# Patient Record
Sex: Male | Born: 1950 | Race: White | Hispanic: No | Marital: Married | Smoking: Former smoker
Health system: Southern US, Community
[De-identification: ages and names within clinical notes are randomized; demographics above are authoritative.]

## PROBLEM LIST (undated history)

## (undated) DIAGNOSIS — K759 Inflammatory liver disease, unspecified: Secondary | ICD-10-CM

## (undated) DIAGNOSIS — E785 Hyperlipidemia, unspecified: Secondary | ICD-10-CM

## (undated) DIAGNOSIS — G4730 Sleep apnea, unspecified: Secondary | ICD-10-CM

## (undated) HISTORY — PX: VASECTOMY: SHX75

## (undated) HISTORY — PX: COLONOSCOPY: SHX174

## (undated) HISTORY — PX: FRACTURE SURGERY: SHX138

---

## 1971-02-15 HISTORY — PX: FINGER AMPUTATION: SHX636

## 2015-09-11 ENCOUNTER — Encounter (HOSPITAL_BASED_OUTPATIENT_CLINIC_OR_DEPARTMENT_OTHER): Payer: Self-pay | Admitting: *Deleted

## 2015-09-11 ENCOUNTER — Emergency Department (HOSPITAL_BASED_OUTPATIENT_CLINIC_OR_DEPARTMENT_OTHER)
Admission: EM | Admit: 2015-09-11 | Discharge: 2015-09-11 | Disposition: A | Discharge status: Home / Self Care | Payer: Medicare Other | Attending: Emergency Medicine | Admitting: Emergency Medicine

## 2015-09-11 ENCOUNTER — Emergency Department (HOSPITAL_BASED_OUTPATIENT_CLINIC_OR_DEPARTMENT_OTHER): Payer: Medicare Other

## 2015-09-11 DIAGNOSIS — Y929 Unspecified place or not applicable: Secondary | ICD-10-CM | POA: Insufficient documentation

## 2015-09-11 DIAGNOSIS — Y939 Activity, unspecified: Secondary | ICD-10-CM | POA: Diagnosis not present

## 2015-09-11 DIAGNOSIS — S82851A Displaced trimalleolar fracture of right lower leg, initial encounter for closed fracture: Secondary | ICD-10-CM | POA: Diagnosis not present

## 2015-09-11 DIAGNOSIS — Z79899 Other long term (current) drug therapy: Secondary | ICD-10-CM | POA: Insufficient documentation

## 2015-09-11 DIAGNOSIS — W109XXA Fall (on) (from) unspecified stairs and steps, initial encounter: Secondary | ICD-10-CM | POA: Insufficient documentation

## 2015-09-11 DIAGNOSIS — M25571 Pain in right ankle and joints of right foot: Secondary | ICD-10-CM | POA: Diagnosis present

## 2015-09-11 DIAGNOSIS — Y999 Unspecified external cause status: Secondary | ICD-10-CM | POA: Diagnosis not present

## 2015-09-11 MED ORDER — OXYCODONE-ACETAMINOPHEN 5-325 MG PO TABS: 1.0000 | ORAL_TABLET | Freq: Four times a day (QID) | ORAL | 0 refills | Status: DC | PRN

## 2015-09-11 NOTE — ED Notes (Signed)
Patient transported to X-ray 

## 2015-09-11 NOTE — ED Provider Notes (Signed)
[facility] Note   CSN: [id number] Arrival date & time: 09/11/15  1926  By signing my name below, I, Alyssa Grove, attest that this documentation has been prepared under the direction and in the presence of Renne Crigler, PA-C. Electronically Signed: Alyssa Grove, ED Scribe. 09/11/15. 8:12 PM.   First MD Initiated Contact with Patient 09/11/15 2005    History   Chief Complaint Chief Complaint  Patient presents with  . Ankle Pain    The history is provided by the patient. No language interpreter was used.   HPI Comments: Dillon Luna is a 65 y.o. male who presents to the Emergency Department by EMS complaining of sudden onset, constant, right ankle pain onset PTA. Pt states he started down the steps. His left foot slid hit the stair and he fell down the steps. He states his pain is exacerbated with movement. He reports associated swelling to right ankle. Pt denies head injury of loss of consciousness. Pt states he does not take any Aspirin daily. Pt does not have an Orthopedic doctor.  History reviewed. No pertinent past medical history.  There are no active problems to display for this patient.   Past Surgical History:  Procedure Laterality Date  . FINGER AMPUTATION        Home Medications    Prior to Admission medications   Medication Sig Start Date End Date Taking? Authorizing Provider  Atorvastatin Calcium (LIPITOR PO) Take by mouth.   Yes Historical Provider, MD    Family History No family history on file.  Social History Social History  Substance Use Topics  . Smoking status: Never Smoker  . Smokeless tobacco: Never Used  . Alcohol use No     Allergies   Review of patient's allergies indicates no known allergies.   Review of Systems Review of Systems  Constitutional: Negative for fever.  Musculoskeletal: Positive for arthralgias, gait problem and joint swelling. Negative for back pain and neck pain.  Skin: Negative for wound.    Neurological: Negative for syncope, weakness and numbness.     Physical Exam Updated Vital Signs BP 131/77   Pulse 67   Temp 98.2 F (36.8 C) (Oral)   Resp 18   Ht 5\' 7"  (1.702 m)   Wt 225 lb (102.1 kg)   SpO2 99%   BMI 35.24 kg/m   Physical Exam  Constitutional: He appears well-developed and well-nourished.  HENT:  Head: Normocephalic and atraumatic.  Eyes: Conjunctivae are normal.  Neck: Normal range of motion. Neck supple.  Cardiovascular: Normal pulses.  Exam reveals no decreased pulses.   Pulses:      Dorsalis pedis pulses are 2+ on the right side, and 2+ on the left side.  Musculoskeletal: He exhibits tenderness. He exhibits no edema.       Right knee: Normal.       Right ankle: He exhibits decreased range of motion and swelling. Tenderness. Lateral malleolus and medial malleolus tenderness found.       Right lower leg: Normal.       Right foot: There is decreased range of motion and tenderness. There is no deformity.  Neurological: He is alert. No sensory deficit.  Motor, sensation, and vascular distal to the injury is fully intact.   Skin: Skin is warm and dry.  Psychiatric: He has a normal mood and affect.  Nursing note and vitals reviewed.    ED Treatments / Results  DIAGNOSTIC STUDIES: Oxygen Saturation is 99% on RA, normal by my  interpretation.    COORDINATION OF CARE: 8:06 PM Discussed treatment plan with pt at bedside which includes reading of the X-ray, medication for pain, and fiberglass cast and pt agreed to plan. Discussed possibility of surgery on ankle. Pt advised to start taking Aspirin daily. Pt advised to get assistance when attempting stairs in his house. Pt given information for Orthopedic surgeon for follow-up.  Labs (all labs ordered are listed, but only abnormal results are displayed) Labs Reviewed - No data to display  EKG  EKG Interpretation None       Radiology Dg Ankle Complete Right  Result Date: 09/11/2015 CLINICAL DATA:   Fall, twisting ankle today.  Pain and swelling. EXAM: RIGHT ANKLE - COMPLETE 3+ VIEW COMPARISON:  None. FINDINGS: There is an oblique mildly displaced fracture through the distal right fibula. Transverse mildly displaced fracture through the medial malleolus. Mild widening of the ankle mortise medially. Posterior malleolar fracture also noted off the posterior tibia. IMPRESSION: Mildly displaced trimalleolar fracture. Widening of the ankle mortise medially. Electronically Signed   By: Charlett Nose M.D.   On: 09/11/2015 20:04   Procedures Procedures (including critical care time)  Medications Ordered in ED Medications - No data to display   Initial Impression / Assessment and Plan / ED Course  I have reviewed the triage vital signs and the nursing notes.  Pertinent labs & imaging results that were available during my care of the patient were reviewed by me and considered in my medical decision making (see chart for details).  Clinical Course  Value Comment By Time  DG Ankle Complete Right (Reviewed) Renne Crigler, PA-C 07/28 2010   8:47 PM Patient was discussed with Jacalyn Lefevre, MD. Plan: crutches, splint. Patient to take ASA daily. Ortho f/u next week. Non-weight bearing. Percocet prn for home, otherwise tylenol. Discussed RICE protocol.   Final Clinical Impressions(s) / ED Diagnoses   Final diagnoses:  Trimalleolar fracture of ankle, closed, right, initial encounter   Patient with trimalleolar ankle fracture. No dislocation. Foot is neurovascularly intact. Treatment as above. Will need orthopedic follow-up. No indication for emergent orthopedic intervention at this time.  New Prescriptions New Prescriptions   OXYCODONE-ACETAMINOPHEN (PERCOCET/ROXICET) 5-325 MG TABLET    Take 1-2 tablets by mouth every 6 (six) hours as needed for severe pain.   I personally performed the services described in this documentation, which was scribed in my presence. The recorded information has been  reviewed and is accurate.     Renne Crigler, PA-C 09/11/15 2049    Jacalyn Lefevre, MD 09/11/15 0981    Jacalyn Lefevre, MD 09/26/15 [phone]

## 2015-09-11 NOTE — Discharge Instructions (Signed)
Please read and follow all provided instructions.  Your diagnoses today include:  1. Trimalleolar fracture of ankle, closed, right, initial encounter     Tests performed today include: An x-ray of the affected area - shows broken tibia and fibula Vital signs. See below for your results today.   Medications prescribed:  Percocet (oxycodone/acetaminophen) - narcotic pain medication  DO NOT drive or perform any activities that require you to be awake and alert because this medicine can make you drowsy. BE VERY CAREFUL not to take multiple medicines containing Tylenol (also called acetaminophen). Doing so can lead to an overdose which can damage your liver and cause liver failure and possibly death.  Use pain medication only under direct supervision at the lowest possible dose needed to control your pain.   Take any prescribed medications only as directed.  Home care instructions:  Follow any educational materials contained in this packet Follow R.I.C.E. Protocol: R - rest your injury  I  - use ice on injury without applying directly to skin C - compress injury with bandage or splint E - elevate the injury as much as possible  Follow-up instructions: Please follow-up with the provided orthopedic physician in the next 1 week.   Return instructions:  Please return if your toes or feet are numb or tingling, appear gray or blue, or you have severe pain (also elevate the leg and loosen splint or wrap if you were given one) Please return to the Emergency Department if you experience worsening symptoms.  Please return if you have any other emergent concerns.  Additional Information:  Your vital signs today were: BP 131/77    Pulse 67    Temp 98.2 F (36.8 C) (Oral)    Resp 18    Ht 5\' 7"  (1.702 m)    Wt 102.1 kg    SpO2 99%    BMI 35.24 kg/m  If your blood pressure (BP) was elevated above 135/85 this visit, please have this repeated by your doctor within one month.

## 2015-09-11 NOTE — ED Notes (Signed)
Returned from xray

## 2015-09-11 NOTE — ED Triage Notes (Addendum)
Pt arrives via ems, pt slipped down two steps in his garage, c/o right ankle pain and swelling. Immobilized prior to arrival.

## 2015-09-14 ENCOUNTER — Other Ambulatory Visit: Payer: Self-pay | Admitting: Physician Assistant

## 2015-09-16 ENCOUNTER — Encounter (HOSPITAL_COMMUNITY): Payer: Self-pay | Admitting: *Deleted

## 2015-09-16 MED ORDER — CEFAZOLIN SODIUM-DEXTROSE 2-4 GM/100ML-% IV SOLN
2.0000 g | INTRAVENOUS | Status: AC
  Administered 2015-09-17: 2 g via INTRAVENOUS

## 2015-09-16 NOTE — Progress Notes (Signed)
Pt denies cardiac history, chest pain or sob. 

## 2015-09-17 ENCOUNTER — Ambulatory Visit (HOSPITAL_COMMUNITY): Payer: Medicare Other | Admitting: Anesthesiology

## 2015-09-17 ENCOUNTER — Ambulatory Visit (HOSPITAL_COMMUNITY): Payer: Medicare Other

## 2015-09-17 ENCOUNTER — Encounter (HOSPITAL_COMMUNITY): Payer: Self-pay | Admitting: *Deleted

## 2015-09-17 ENCOUNTER — Encounter (HOSPITAL_COMMUNITY)
Admission: RE | Disposition: A | Discharge status: Home / Self Care | Payer: Self-pay | Source: Ambulatory Visit | Attending: Orthopaedic Surgery

## 2015-09-17 ENCOUNTER — Observation Stay (HOSPITAL_COMMUNITY)
Admission: RE | Admit: 2015-09-17 | Discharge: 2015-09-18 | Disposition: A | Discharge status: Home / Self Care | Payer: Medicare Other | Source: Ambulatory Visit | Attending: Orthopaedic Surgery | Admitting: Orthopaedic Surgery

## 2015-09-17 DIAGNOSIS — S82851A Displaced trimalleolar fracture of right lower leg, initial encounter for closed fracture: Principal | ICD-10-CM

## 2015-09-17 DIAGNOSIS — R262 Difficulty in walking, not elsewhere classified: Secondary | ICD-10-CM | POA: Insufficient documentation

## 2015-09-17 DIAGNOSIS — Z419 Encounter for procedure for purposes other than remedying health state, unspecified: Secondary | ICD-10-CM

## 2015-09-17 DIAGNOSIS — S82853A Displaced trimalleolar fracture of unspecified lower leg, initial encounter for closed fracture: Secondary | ICD-10-CM | POA: Diagnosis present

## 2015-09-17 DIAGNOSIS — W19XXXA Unspecified fall, initial encounter: Secondary | ICD-10-CM | POA: Insufficient documentation

## 2015-09-17 DIAGNOSIS — E785 Hyperlipidemia, unspecified: Secondary | ICD-10-CM | POA: Diagnosis not present

## 2015-09-17 HISTORY — DX: Hyperlipidemia, unspecified: E78.5

## 2015-09-17 HISTORY — PX: ORIF ANKLE FRACTURE: SHX5408

## 2015-09-17 HISTORY — DX: Sleep apnea, unspecified: G47.30

## 2015-09-17 HISTORY — PX: ORIF ANKLE FRACTURE: SUR919

## 2015-09-17 HISTORY — DX: Inflammatory liver disease, unspecified: K75.9

## 2015-09-17 LAB — CBC
HEMATOCRIT: 46 % (ref 39.0–52.0)
Hemoglobin: 15.7 g/dL (ref 13.0–17.0)
MCH: 32.3 pg (ref 26.0–34.0)
MCHC: 34.1 g/dL (ref 30.0–36.0)
MCV: 94.7 fL (ref 78.0–100.0)
Platelets: 287 10*3/uL (ref 150–400)
RBC: 4.86 MIL/uL (ref 4.22–5.81)
RDW: 12.9 % (ref 11.5–15.5)
WBC: 12.3 10*3/uL — ABNORMAL HIGH (ref 4.0–10.5)

## 2015-09-17 SURGERY — OPEN REDUCTION INTERNAL FIXATION (ORIF) ANKLE FRACTURE
Anesthesia: General | Site: Ankle | Laterality: Right

## 2015-09-17 MED ORDER — CEFAZOLIN IN D5W 1 GM/50ML IV SOLN
1.0000 g | Freq: Four times a day (QID) | INTRAVENOUS | Status: AC
  Administered 2015-09-17 – 2015-09-18 (×3): 1 g via INTRAVENOUS
  Filled 2015-09-17 (×3): qty 50

## 2015-09-17 MED ORDER — METOCLOPRAMIDE HCL 5 MG PO TABS: 5.0000 mg | ORAL_TABLET | Freq: Three times a day (TID) | ORAL | Status: DC | PRN

## 2015-09-17 MED ORDER — ONDANSETRON HCL 4 MG PO TABS: 4.0000 mg | ORAL_TABLET | Freq: Four times a day (QID) | ORAL | Status: DC | PRN

## 2015-09-17 MED ORDER — FENTANYL CITRATE (PF) 100 MCG/2ML IJ SOLN
INTRAMUSCULAR | Status: DC | PRN
  Administered 2015-09-17: 50 ug via INTRAVENOUS
  Administered 2015-09-17 (×5): 25 ug via INTRAVENOUS
  Administered 2015-09-17: 50 ug via INTRAVENOUS
  Administered 2015-09-17: 25 ug via INTRAVENOUS

## 2015-09-17 MED ORDER — MIDAZOLAM HCL 2 MG/2ML IJ SOLN
1.0000 mg | Freq: Once | INTRAMUSCULAR | Status: AC
  Administered 2015-09-17: 1.5 mg via INTRAVENOUS

## 2015-09-17 MED ORDER — ONDANSETRON HCL 4 MG/2ML IJ SOLN: 4.0000 mg | Freq: Four times a day (QID) | INTRAMUSCULAR | Status: DC | PRN

## 2015-09-17 MED ORDER — LACTATED RINGERS IV SOLN
INTRAVENOUS | Status: DC
  Administered 2015-09-17 (×2): via INTRAVENOUS

## 2015-09-17 MED ORDER — SODIUM CHLORIDE 0.9 % IV SOLN
INTRAVENOUS | Status: DC
  Administered 2015-09-17: via INTRAVENOUS

## 2015-09-17 MED ORDER — METHOCARBAMOL 1000 MG/10ML IJ SOLN
500.0000 mg | Freq: Four times a day (QID) | INTRAVENOUS | Status: DC | PRN
  Filled 2015-09-17: qty 5

## 2015-09-17 MED ORDER — DIPHENHYDRAMINE HCL 12.5 MG/5ML PO ELIX: 12.5000 mg | ORAL_SOLUTION | ORAL | Status: DC | PRN

## 2015-09-17 MED ORDER — PROPOFOL 10 MG/ML IV BOLUS
INTRAVENOUS | Status: DC | PRN
Start: 2015-09-17 — End: 2015-09-17
  Administered 2015-09-17: 170 mg via INTRAVENOUS

## 2015-09-17 MED ORDER — MIDAZOLAM HCL 2 MG/2ML IJ SOLN
INTRAMUSCULAR | Status: AC
  Filled 2015-09-17: qty 2

## 2015-09-17 MED ORDER — FENTANYL CITRATE (PF) 250 MCG/5ML IJ SOLN
INTRAMUSCULAR | Status: AC
  Filled 2015-09-17: qty 5

## 2015-09-17 MED ORDER — MIDAZOLAM HCL 2 MG/2ML IJ SOLN
INTRAMUSCULAR | Status: AC
  Administered 2015-09-17: 1.5 mg via INTRAVENOUS
  Filled 2015-09-17: qty 2

## 2015-09-17 MED ORDER — CEFAZOLIN SODIUM-DEXTROSE 2-4 GM/100ML-% IV SOLN
INTRAVENOUS | Status: AC
  Filled 2015-09-17: qty 100

## 2015-09-17 MED ORDER — ACETAMINOPHEN 650 MG RE SUPP: 650.0000 mg | Freq: Four times a day (QID) | RECTAL | Status: DC | PRN

## 2015-09-17 MED ORDER — 0.9 % SODIUM CHLORIDE (POUR BTL) OPTIME
TOPICAL | Status: DC | PRN
  Administered 2015-09-17: 1000 mL

## 2015-09-17 MED ORDER — PROMETHAZINE HCL 25 MG/ML IJ SOLN: 6.2500 mg | INTRAMUSCULAR | Status: DC | PRN

## 2015-09-17 MED ORDER — FENTANYL CITRATE (PF) 100 MCG/2ML IJ SOLN
INTRAMUSCULAR | Status: AC
  Administered 2015-09-17: 100 ug via INTRAVENOUS
  Filled 2015-09-17: qty 2

## 2015-09-17 MED ORDER — ACETAMINOPHEN 325 MG PO TABS: 650.0000 mg | ORAL_TABLET | Freq: Four times a day (QID) | ORAL | Status: DC | PRN

## 2015-09-17 MED ORDER — CHLORHEXIDINE GLUCONATE 4 % EX LIQD: 60.0000 mL | Freq: Once | CUTANEOUS | Status: DC

## 2015-09-17 MED ORDER — FENTANYL CITRATE (PF) 100 MCG/2ML IJ SOLN
50.0000 ug | Freq: Once | INTRAMUSCULAR | Status: AC
  Administered 2015-09-17: 100 ug via INTRAVENOUS

## 2015-09-17 MED ORDER — METOCLOPRAMIDE HCL 5 MG/ML IJ SOLN: 5.0000 mg | Freq: Three times a day (TID) | INTRAMUSCULAR | Status: DC | PRN

## 2015-09-17 MED ORDER — BUPIVACAINE HCL (PF) 0.5 % IJ SOLN
INTRAMUSCULAR | Status: DC | PRN
  Administered 2015-09-17: 30 mL via PERINEURAL

## 2015-09-17 MED ORDER — METHOCARBAMOL 500 MG PO TABS: 500.0000 mg | ORAL_TABLET | Freq: Four times a day (QID) | ORAL | Status: DC | PRN

## 2015-09-17 MED ORDER — HYDROMORPHONE HCL 1 MG/ML IJ SOLN: 0.2500 mg | INTRAMUSCULAR | Status: DC | PRN

## 2015-09-17 MED ORDER — ASPIRIN 325 MG PO TABS
325.0000 mg | ORAL_TABLET | Freq: Two times a day (BID) | ORAL | Status: DC
Start: 2015-09-17 — End: 2015-09-18
  Administered 2015-09-17 – 2015-09-18 (×2): 325 mg via ORAL
  Filled 2015-09-17 (×2): qty 1

## 2015-09-17 MED ORDER — ONDANSETRON HCL 4 MG/2ML IJ SOLN
INTRAMUSCULAR | Status: DC | PRN
  Administered 2015-09-17 (×2): 4 mg via INTRAVENOUS

## 2015-09-17 MED ORDER — PROPOFOL 10 MG/ML IV BOLUS
INTRAVENOUS | Status: AC
  Filled 2015-09-17: qty 40

## 2015-09-17 MED ORDER — LIDOCAINE 2% (20 MG/ML) 5 ML SYRINGE
INTRAMUSCULAR | Status: AC
  Filled 2015-09-17: qty 5

## 2015-09-17 MED ORDER — OXYCODONE HCL 5 MG PO TABS
5.0000 mg | ORAL_TABLET | ORAL | Status: DC | PRN
  Administered 2015-09-18: 10 mg via ORAL
  Filled 2015-09-17: qty 2

## 2015-09-17 MED ORDER — DEXAMETHASONE SODIUM PHOSPHATE 10 MG/ML IJ SOLN
INTRAMUSCULAR | Status: DC | PRN
  Administered 2015-09-17: 5 mg via INTRAVENOUS

## 2015-09-17 MED ORDER — HYDROMORPHONE HCL 1 MG/ML IJ SOLN: 1.0000 mg | INTRAMUSCULAR | Status: DC | PRN

## 2015-09-17 SURGICAL SUPPLY — 67 items
BANDAGE ACE 4X5 VEL STRL LF (GAUZE/BANDAGES/DRESSINGS) ×6 IMPLANT
BANDAGE ELASTIC 4 VELCRO ST LF (GAUZE/BANDAGES/DRESSINGS) IMPLANT
BANDAGE ELASTIC 6 VELCRO ST LF (GAUZE/BANDAGES/DRESSINGS) IMPLANT
BANDAGE ESMARK 6X9 LF (GAUZE/BANDAGES/DRESSINGS) IMPLANT
BIT DRILL CANN 2.7 (BIT) ×1
BIT DRILL CANN 2.7MM (BIT) ×1
BIT DRILL SRG 2.7XCANN AO CPLG (BIT) ×1 IMPLANT
BIT DRL SRG 2.7XCANN AO CPLNG (BIT) ×1
BNDG ESMARK 6X9 LF (GAUZE/BANDAGES/DRESSINGS)
CLOSURE STERI-STRIP 1/2X4 (GAUZE/BANDAGES/DRESSINGS) ×1
CLSR STERI-STRIP ANTIMIC 1/2X4 (GAUZE/BANDAGES/DRESSINGS) ×2 IMPLANT
COVER SURGICAL LIGHT HANDLE (MISCELLANEOUS) ×3 IMPLANT
CUFF TOURNIQUET SINGLE 34IN LL (TOURNIQUET CUFF) IMPLANT
CUFF TOURNIQUET SINGLE 44IN (TOURNIQUET CUFF) ×3 IMPLANT
DRAPE C-ARM 42X72 X-RAY (DRAPES) ×3 IMPLANT
DRAPE U-SHAPE 47X51 STRL (DRAPES) ×3 IMPLANT
DRILL 2.6X122MM WL AO SHAFT (BIT) ×3 IMPLANT
DRSG PAD ABDOMINAL 8X10 ST (GAUZE/BANDAGES/DRESSINGS) ×3 IMPLANT
DURAPREP 26ML APPLICATOR (WOUND CARE) ×3 IMPLANT
ELECT REM PT RETURN 9FT ADLT (ELECTROSURGICAL) ×3
ELECTRODE REM PT RTRN 9FT ADLT (ELECTROSURGICAL) ×1 IMPLANT
GAUZE SPONGE 4X4 12PLY STRL (GAUZE/BANDAGES/DRESSINGS) ×3 IMPLANT
GAUZE XEROFORM 1X8 LF (GAUZE/BANDAGES/DRESSINGS) ×6 IMPLANT
GAUZE XEROFORM 5X9 LF (GAUZE/BANDAGES/DRESSINGS) ×3 IMPLANT
GLOVE BIO SURGEON STRL SZ8 (GLOVE) ×3 IMPLANT
GLOVE ORTHO TXT STRL SZ7.5 (GLOVE) ×3 IMPLANT
GOWN STRL REUS W/ TWL LRG LVL3 (GOWN DISPOSABLE) ×2 IMPLANT
GOWN STRL REUS W/ TWL XL LVL3 (GOWN DISPOSABLE) ×4 IMPLANT
GOWN STRL REUS W/TWL LRG LVL3 (GOWN DISPOSABLE) ×4
GOWN STRL REUS W/TWL XL LVL3 (GOWN DISPOSABLE) ×8
K-WIRE ORTHOPEDIC 1.4X150L (WIRE) ×6
KIT BASIN OR (CUSTOM PROCEDURE TRAY) ×3 IMPLANT
KIT ROOM TURNOVER OR (KITS) ×3 IMPLANT
KWIRE ORTHOPEDIC 1.4X150L (WIRE) ×2 IMPLANT
MANIFOLD NEPTUNE II (INSTRUMENTS) ×3 IMPLANT
NEEDLE HYPO 25GX1X1/2 BEV (NEEDLE) IMPLANT
NS IRRIG 1000ML POUR BTL (IV SOLUTION) ×6 IMPLANT
PACK ORTHO EXTREMITY (CUSTOM PROCEDURE TRAY) ×3 IMPLANT
PAD ABD 8X10 STRL (GAUZE/BANDAGES/DRESSINGS) ×3 IMPLANT
PAD ARMBOARD 7.5X6 YLW CONV (MISCELLANEOUS) ×6 IMPLANT
PAD CAST 4YDX4 CTTN HI CHSV (CAST SUPPLIES) ×2 IMPLANT
PADDING CAST COTTON 4X4 STRL (CAST SUPPLIES) ×4
PADDING CAST COTTON 6X4 STRL (CAST SUPPLIES) ×3 IMPLANT
PLATE FIBULA 4H (Plate) ×3 IMPLANT
PREFILTER NEPTUNE (MISCELLANEOUS) ×3 IMPLANT
SCREW BONE 14MMX3.5MM (Screw) ×6 IMPLANT
SCREW BONE ANKLE 3.5X12MM (Screw) ×6 IMPLANT
SCREW BONE ANKLE 3.5X14MM (Screw) ×3 IMPLANT
SCREW BONE NON-LCKING 3.5X12MM (Screw) ×3 IMPLANT
SCREW CAN P/T ANKLE 4.0X50MM (Screw) ×6 IMPLANT
SCREW LOCK 3.5X10MM (Screw) ×3 IMPLANT
SPLINT PLASTER CAST XFAST 5X30 (CAST SUPPLIES) ×1 IMPLANT
SPLINT PLASTER XFAST SET 5X30 (CAST SUPPLIES) ×2
SPONGE GAUZE 4X4 12PLY STER LF (GAUZE/BANDAGES/DRESSINGS) ×3 IMPLANT
SPONGE LAP 4X18 X RAY DECT (DISPOSABLE) ×6 IMPLANT
SUCTION FRAZIER HANDLE 10FR (MISCELLANEOUS) ×2
SUCTION TUBE FRAZIER 10FR DISP (MISCELLANEOUS) ×1 IMPLANT
SUT ETHILON 2 0 FS 18 (SUTURE) ×3 IMPLANT
SUT VIC AB 2-0 CT1 27 (SUTURE) ×4
SUT VIC AB 2-0 CT1 27XBRD (SUTURE) ×2 IMPLANT
SUT VICRYL 0 CT 1 36IN (SUTURE) ×6 IMPLANT
SYR CONTROL 10ML LL (SYRINGE) IMPLANT
TOWEL OR 17X24 6PK STRL BLUE (TOWEL DISPOSABLE) ×3 IMPLANT
TOWEL OR 17X26 10 PK STRL BLUE (TOWEL DISPOSABLE) ×3 IMPLANT
TUBE CONNECTING 12'X1/4 (SUCTIONS) ×1
TUBE CONNECTING 12X1/4 (SUCTIONS) ×2 IMPLANT
WATER STERILE IRR 1000ML POUR (IV SOLUTION) ×3 IMPLANT

## 2015-09-17 NOTE — Anesthesia Preprocedure Evaluation (Signed)
Anesthesia Evaluation  Patient identified by MRN, date of birth, ID band Patient awake    History of Anesthesia Complications Negative for: history of anesthetic complications  Airway Mallampati: II  TM Distance: >3 FB Neck ROM: Full    Dental  (+) Teeth Intact   Pulmonary    breath sounds clear to auscultation       Cardiovascular negative cardio ROS   Rhythm:Regular Rate:Normal     Neuro/Psych negative neurological ROS     GI/Hepatic negative GI ROS, (+) Hepatitis -  Endo/Other  negative endocrine ROS  Renal/GU negative Renal ROS     Musculoskeletal Fractured ankle    Abdominal   Peds  Hematology negative hematology ROS (+)   Anesthesia Other Findings   Reproductive/Obstetrics                             Anesthesia Physical Anesthesia Plan  ASA: II  Anesthesia Plan: General   Post-op Pain Management: GA combined w/ Regional for post-op pain   Induction: Intravenous  Airway Management Planned: LMA  Additional Equipment:   Intra-op Plan:   Post-operative Plan: Extubation in OR  Informed Consent:   Dental advisory given  Plan Discussed with: CRNA  Anesthesia Plan Comments:         Anesthesia Quick Evaluation

## 2015-09-17 NOTE — Brief Op Note (Signed)
09/17/2015  11:37 AM  PATIENT:  Dillon Luna  65 y.o. male  PRE-OPERATIVE DIAGNOSIS:  Right trimalleolar ankle fracture  POST-OPERATIVE DIAGNOSIS:  Right trimalleolar ankle fracture  PROCEDURE:  Procedure(s): OPEN REDUCTION INTERNAL FIXATION (ORIF) RIGHT ANKLE FRACTURE (Right)  SURGEON:  Surgeon(s) and Role:    * Kathryne Hitch, MD - Primary  PHYSICIAN ASSISTANT: Rexene Edison, PA-C  ANESTHESIA:   regional and general  EBL:  Total I/O In: 1000 [I.V.:1000] Out: -   COUNTS:  YES  TOURNIQUET:  * Missing tourniquet times found for documented tourniquets in log:  [id number] *  DICTATION: .Other Dictation: Dictation Number [id number]  PLAN OF CARE: Admit for overnight observation  PATIENT DISPOSITION:  PACU - hemodynamically stable.   Delay start of Pharmacological VTE agent (>24hrs) due to surgical blood loss or risk of bleeding: no

## 2015-09-17 NOTE — Anesthesia Procedure Notes (Signed)
Anesthesia Regional Block:  Popliteal block  Pre-Anesthetic Checklist: ,, timeout performed, Correct Patient, Correct Site, Correct Laterality, Correct Position, site marked, risks and benefits discussed, surgical consent, pre-op evaluation,  At surgeon's request and post-op pain management  Laterality: Right and Lower  Prep: chloraprep       Needles:   Needle Type: Stimulator Needle - 80     Needle Length: 9cm 9 cm Needle Gauge: 21 and 21 G  Needle insertion depth: 5 cm   Additional Needles:  Procedures: ultrasound guided (picture in chart) Popliteal block Narrative:  Start time: 09/17/2015 9:15 AM End time: 09/17/2015 9:30 AM Injection made incrementally with aspirations every 5 mL.  Performed by: Personally  Anesthesiologist: Merlene Dante  Additional Notes: Tolerated well

## 2015-09-17 NOTE — Transfer of Care (Signed)
Immediate Anesthesia Transfer of Care Note  Patient: Dillon Luna  Procedure(s) Performed: Procedure(s): OPEN REDUCTION INTERNAL FIXATION (ORIF) RIGHT ANKLE FRACTURE (Right)  Patient Location: PACU  Anesthesia Type:General  Level of Consciousness:  sedated, patient cooperative and responds to stimulation  Airway & Oxygen Therapy:Patient Spontanous Breathing and Patient connected to face mask oxgen  Post-op Assessment:  Report given to PACU RN and Post -op Vital signs reviewed and stable  Post vital signs:  Reviewed and stable  Last Vitals:  Vitals:   09/17/15 0930 09/17/15 0935  BP: 123/83 139/78  Pulse: 67 69  Resp: 15 13  Temp:      Complications: No apparent anesthesia complications

## 2015-09-17 NOTE — H&P (Signed)
Dillon Luna is an 65 y.o. male.   Chief Complaint:   Right ankle pain; known unstable fracture HPI:   65 yo male who sustained an accidental mechanical fall this past weekend and suffered an unstable right trimalleolar ankle fracture.  He now presents for recommended definitive fixation of this fracture.  Past Medical History:  Diagnosis Date  . 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 Hyperlipidemia   . Sleep apnea    no cpap - from problem list in Care Everywhere. Pt states he had a sleep study done but didn't need cpap.    Past Surgical History:  Procedure Laterality Date  . COLONOSCOPY    . FINGER AMPUTATION    . VASECTOMY      Family History  Problem Relation Age of Onset  . Lung cancer Mother   . Lung cancer Father    Social History:  reports that he has never smoked. He has never used smokeless tobacco. He reports that he drinks about 0.6 oz of alcohol per week . He reports that he does not use drugs.  Allergies: No Known Allergies  No prescriptions prior to admission.    No results found for this or any previous visit (from the past 48 hour(s)). No results found.  Review of Systems  All other systems reviewed and are negative.   There were no vitals taken for this visit. Physical Exam  Constitutional: He is oriented to person, place, and time. He appears well-developed and well-nourished.  HENT:  Head: Normocephalic and atraumatic.  Eyes: EOM are normal. Pupils are equal, round, and reactive to light.  Neck: Normal range of motion. Neck supple.  Cardiovascular: Normal rate and regular rhythm.   Respiratory: Effort normal and breath sounds normal.  GI: Soft. Bowel sounds are normal.  Musculoskeletal:       Right ankle: He exhibits decreased range of motion, swelling, ecchymosis and deformity. Tenderness. Lateral malleolus and medial malleolus tenderness found.  Neurological: He is alert and oriented to person, place, and time.  Skin: Skin is warm and  dry.  Psychiatric: He has a normal mood and affect.     Assessment/Plan Close right unstable trimalleolar ankle fracture 1)  To the OR today for open reduction/internal fixation of his right ankle.  Risks and benefits have been discussed in detail.  Admit overnight.  Kathryne Hitch, MD 09/17/2015, 7:23 AM

## 2015-09-17 NOTE — Op Note (Signed)
NAMEEVERTH, LAPINE NO.:  [id number]  MEDICAL RECORD NO.:  [id number]  LOCATION:  [facility]                         FACILITY:  [facility]  PHYSICIAN:  Vanita Panda. Magnus Ivan, M.D.DATE OF BIRTH:  Jul 12, 1950  DATE OF PROCEDURE:  09/17/2015 DATE OF DISCHARGE:                              OPERATIVE REPORT   PREOPERATIVE DIAGNOSIS:  Unstable right trimalleolar ankle fracture.  POSTOPERATIVE DIAGNOSIS:  Unstable right trimalleolar ankle fracture.  PROCEDURE:  Open reduction and internal fixation of right ankle trimalleolar fracture with fixation of the lateral and medial malleolus only.  IMPLANTS:  [employer] VariAx ankle plating system with a lateral 4-hole distal fibular plate and two 3.3-KP cannulated screws medially.  SURGEON:  Vanita Panda. Magnus Ivan, M.D.  ASSISTANT:  Richardean Canal, PA-C.  ANESTHESIA: 1. Regional right lower extremity popliteal block. 2. General.  BLOOD LOSS:  Minimal.  TOURNIQUET TIME:  Less than an hour and half.  COMPLICATIONS:  None.  INDICATIONS:  Mr. Dillon Luna is a 65 year old gentleman, who sustained a mechanical fall accidentally this past weekend.  He was seen at [facility], [facility] the [facility], was found to have a trimalleolar ankle fracture.  He was placed in a splint that was well padded and given followup in our office since we were on-call, and we showed him his x-rays, explained in detail the nature of this type of unstable ankle fracture and the need for surgery to stabilize the ankle itself.  He understands this fully and after explanation of risks and benefits of surgery as well.  He did wish to proceed to the OR.  PROCEDURE DESCRIPTION:  After informed consent was obtained, appropriate right ankle was marked.  Anesthesia obtained a popliteal block.  He was then brought to the operating room, placed supine on the operating table.  General anesthesia was then obtained.  A nonsterile tourniquet was placed on his  upper right thigh and a bump was placed under his hip to internally rotate the leg.  Time-out was called and he was identified as correct patient and correct right leg and this was after prepping with DuraPrep and sterile drapes.  We then used an Esmarch to wrap out the leg and tourniquet was inflated to 300 mm of pressure.  We then placed a bump under the ankle and made an incision on the lateral malleolus and carried this proximally and distally.  We dissected down the fracture and found a comminuted lateral malleolus fracture.  We were able to get it in acceptable reduced position and then held it with reduction clamp and placed our 4-hole distal fibular plate along the lateral cortex of the fibula.  We secured this with bicortical screws proximally and locking screws distally.  Once we felt good about the reduction of the fibula, the ankle mortise stayed reduced, went to the medial side, made a several medial incision.  We were able to expose the fracture and it was a very large medial malleolus piece and we were able to see into the joint and we irrigated the ankle joint, I did not see any damage to the talus.  I then held the fracture in reduced position temporarily and we placed two  guidepins in parallel fashion in the anterior and posterior facets of the medial malleolus.  We then overdrilled this with near cortex and placed two 50-mm length 4.0 cannulated screws to hold the fracture reduced.  We then stressed the ankle mortise and it was entirely stable and intact.  The posterior malleolus piece with such a small percentage, we did not need to have to address that.  We then irrigated both medial and lateral wounds in normal saline solution.  We closed the deep tissue with 0 Vicryl suture followed by 2-0 Vicryl and then 2-0 nylon on the skin.  Xeroform and well-padded sterile dressing were applied.  We did place him in a plaster splint with the foot in neutral position.  He was  awakened, extubated and taken to the recovery room in stable condition.  All final counts were correct.  There were no complications noted.  Of note, Richardean Canal, PA-C assisted in the entire case, his assistance was crucial for helping to facilitate this case.     Vanita Panda. Magnus Ivan, M.D.     CYB/MEDQ  D:  09/17/2015  T:  09/17/2015  Job:  [id number]

## 2015-09-17 NOTE — Anesthesia Procedure Notes (Signed)
Procedure Name: LMA Insertion Date/Time: 09/17/2015 10:24 AM Performed by: Army Fossa Pre-anesthesia Checklist: Patient identified, Emergency Drugs available, Suction available and Patient being monitored Patient Re-evaluated:Patient Re-evaluated prior to inductionOxygen Delivery Method: Circle System Utilized Preoxygenation: Pre-oxygenation with 100% oxygen Intubation Type: IV induction Ventilation: Mask ventilation without difficulty LMA: LMA inserted LMA Size: 4.0 Number of attempts: 1 Airway Equipment and Method: Bite block and Patient positioned with wedge pillow Placement Confirmation: positive ETCO2 and breath sounds checked- equal and bilateral Tube secured with: Tape Dental Injury: Teeth and Oropharynx as per pre-operative assessment

## 2015-09-18 ENCOUNTER — Encounter (HOSPITAL_COMMUNITY): Payer: Self-pay | Admitting: Orthopaedic Surgery

## 2015-09-18 DIAGNOSIS — S82851A Displaced trimalleolar fracture of right lower leg, initial encounter for closed fracture: Secondary | ICD-10-CM | POA: Diagnosis not present

## 2015-09-18 MED ORDER — OXYCODONE-ACETAMINOPHEN 5-325 MG PO TABS: 1.0000 | ORAL_TABLET | ORAL | 0 refills | Status: DC | PRN

## 2015-09-18 NOTE — Evaluation (Signed)
Physical Therapy Evaluation Patient Details Name: Dillon Luna MRN: [id number] DOB: June 10, 1950 Today's Date: 09/18/2015   History of Present Illness  65 y.o. male now s/p ORIF Rt ankle fx. PMH: hepititis  Clinical Impression  Pt seen for initial eval and treatment. At this time the pt has been able to ambulate with crutches and perform stairs. Pt states that he is feeling confident with being able to go home when released. PT to follow acutely. Pt declined any HHPT services.     Follow Up Recommendations No PT follow up;Supervision for mobility/OOB    Equipment Recommendations  None recommended by PT    Recommendations for Other Services       Precautions / Restrictions Precautions Precautions: Fall Restrictions Weight Bearing Restrictions: Yes RLE Weight Bearing: Non weight bearing      Mobility  Bed Mobility Overal bed mobility: Independent                Transfers Overall transfer level: Needs assistance Equipment used: Crutches Transfers: Sit to/from Stand Sit to Stand: Supervision         General transfer comment: reviewed transfer technique with crutches with emphasis on safety.   Ambulation/Gait Ambulation/Gait assistance: Min guard Ambulation Distance (Feet): 100 Feet Assistive device: Crutches Gait Pattern/deviations:  (swing-to pattern) Gait velocity: decreased   General Gait Details: consistent with NWB  Stairs Stairs: Yes Stairs assistance: Min guard Stair Management: No rails;One rail Left;Step to pattern;Forwards;With crutches Number of Stairs:  (flight +3) General stair comments: Ambulating up/down 1 flight of stairs with Lt rail and Rt crutch, min guard assist. Up/down 3 stairs no rail min guard assist. Pt confirms feeling confident with stairs at home, reinforced having assist for safety   Wheelchair Mobility    Modified Rankin (Stroke Patients Only)       Balance Overall balance assessment: Needs assistance Sitting-balance  support: No upper extremity supported Sitting balance-Leahy Scale: Good     Standing balance support: Bilateral upper extremity supported Standing balance-Leahy Scale: Poor Standing balance comment: using crutches                             Pertinent Vitals/Pain Pain Assessment: No/denies pain Pain Intervention(s): Monitored during session    Home Living Family/patient expects to be discharged to:: Private residence Living Arrangements: Spouse/significant other Available Help at Discharge: Family;Available 24 hours/day Type of Home: House Home Access: Stairs to enter Entrance Stairs-Rails: None Entrance Stairs-Number of Steps: 2 Home Layout: Two level Home Equipment: Crutches      Prior Function Level of Independence: Independent with assistive device(s)         Comments: using crutches since initial injury.      Hand Dominance        Extremity/Trunk Assessment   Upper Extremity Assessment: Overall WFL for tasks assessed           Lower Extremity Assessment: RLE deficits/detail RLE Deficits / Details: able to move independently       Communication   Communication: No difficulties  Cognition Arousal/Alertness: Awake/alert Behavior During Therapy: WFL for tasks assessed/performed Overall Cognitive Status: Within Functional Limits for tasks assessed                      General Comments      Exercises        Assessment/Plan    PT Assessment Patient needs continued PT services  PT Diagnosis Difficulty walking  PT Problem List Decreased strength;Decreased activity tolerance;Decreased balance;Decreased mobility  PT Treatment Interventions DME instruction;Gait training;Stair training;Functional mobility training;Therapeutic activities;Patient/family education   PT Goals (Current goals can be found in the Care Plan section) Acute Rehab PT Goals Patient Stated Goal: go home PT Goal Formulation: With patient Time For Goal  Achievement: 09/25/15 Potential to Achieve Goals: Good    Frequency Min 5X/week   Barriers to discharge        Co-evaluation               End of Session Equipment Utilized During Treatment: Gait belt Activity Tolerance: Patient tolerated treatment well Patient left: in chair;with call bell/phone within reach Nurse Communication: Mobility status    Functional Assessment Tool Used: clinical judgment Functional Limitation: Mobility: Walking and moving around Mobility: Walking and Moving Around Current Status [phone]): At least 20 percent but less than 40 percent impaired, limited or restricted Mobility: Walking and Moving Around Goal Status [phone]): At least 1 percent but less than 20 percent impaired, limited or restricted    Time: 0832-0914 PT Time Calculation (min) (ACUTE ONLY): 42 min   Charges:   PT Evaluation $PT Eval Moderate Complexity: 1 Procedure PT Treatments $Gait Training: 23-37 mins   PT G Codes:   PT G-Codes **NOT FOR INPATIENT CLASS** Functional Assessment Tool Used: clinical judgment Functional Limitation: Mobility: Walking and moving around Mobility: Walking and Moving Around Current Status (M0768): At least 20 percent but less than 40 percent impaired, limited or restricted Mobility: Walking and Moving Around Goal Status [phone]): At least 1 percent but less than 20 percent impaired, limited or restricted    Christiane Ha, PT, CSCS Pager [phone] Office [phone]  09/18/2015, 10:12 AM

## 2015-09-18 NOTE — Care Management Obs Status (Signed)
MEDICARE OBSERVATION STATUS NOTIFICATION   Patient Details  Name: Dillon Luna MRN: [id number] Date of Birth: 1950-08-27   Medicare Observation Status Notification Given:  Yes    Durenda Guthrie, RN 09/18/2015, 11:39 AM

## 2015-09-18 NOTE — Discharge Instructions (Signed)
Keep your splint clean and dry. No weight on your right ankle. Elevation as needed for swelling.

## 2015-09-18 NOTE — Progress Notes (Signed)
Patient ID: Dillon Luna, male   DOB: Jul 04, 1950, 65 y.o.   MRN: [id number] Doing well.  Feels comfortable.  Can discharge to home this afternoon after has PT.

## 2015-09-18 NOTE — Care Management Note (Signed)
Case Management Note  Patient Details  Name: KHUP MERRIWEATHER MRN: [id number] Date of Birth: April 28, 1950  Subjective/Objective:   65 yr old male s/p ORIF of right ankle fracture.                 Action/Plan: Case manager spoke with patient concerning DME needs. Patient states he has rutches and has been instructed on how to navigate stairs with them. States he doesn't need a rolling walker. Physical therapist states patient will not need HH PT. Patient will have family support at discharge.  Expected Discharge Date:    09/18/15              Expected Discharge Plan:  Home/Self Care  In-House Referral:  NA  Discharge planning Services  CM Consult  Post Acute Care Choice:  Home Health, Durable Medical Equipment Choice offered to:  Patient  DME Arranged:  N/A DME Agency:     HH Arranged:  Patient Refused (Per PT patient does not need HH ) HH Agency:  NA  Status of Service:  Completed, signed off  If discussed at Long Length of Stay Meetings, dates discussed:    Additional Comments:  Durenda Guthrie, RN 09/18/2015, 11:57 AM

## 2015-09-18 NOTE — Anesthesia Postprocedure Evaluation (Signed)
Anesthesia Post Note  Patient: JAMIAS DAMM  Procedure(s) Performed: Procedure(s) (LRB): OPEN REDUCTION INTERNAL FIXATION (ORIF) RIGHT ANKLE FRACTURE (Right)  Patient location during evaluation: PACU Anesthesia Type: General and Regional Level of consciousness: awake and alert Pain management: pain level controlled Vital Signs Assessment: post-procedure vital signs reviewed and stable Respiratory status: spontaneous breathing, nonlabored ventilation, respiratory function stable and patient connected to nasal cannula oxygen Cardiovascular status: blood pressure returned to baseline and stable Postop Assessment: no signs of nausea or vomiting Anesthetic complications: no    Last Vitals:  Vitals:   09/17/15 1935 09/18/15 0630  BP: 114/65 114/72  Pulse: 80 67  Resp: 15   Temp: 37 C 36.7 C    Last Pain:  Vitals:   09/18/15 0630  TempSrc: Oral  PainSc:                  Aairah Negrette,JAMES TERRILL

## 2015-09-18 NOTE — Discharge Summary (Signed)
Patient ID: Dillon Luna MRN: [id number] DOB/AGE: [birth year] 65 y.o.  Admit date: 09/17/2015 Discharge date: 09/18/2015  Admission Diagnoses:  Principal Problem:   Trimalleolar fracture of right ankle Active Problems:   Trimalleolar fracture of ankle, closed   Discharge Diagnoses:  Same  Past Medical History:  Diagnosis Date  . 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 Hyperlipidemia   . Sleep apnea    no cpap - from problem list in Care Everywhere. Pt states he had a sleep study done but didn't need cpap.    Surgeries: Procedure(s): OPEN REDUCTION INTERNAL FIXATION (ORIF) RIGHT ANKLE FRACTURE on 09/17/2015   Consultants:   Discharged Condition: Improved  Hospital Course: Dillon Luna is an 65 y.o. male who was admitted 09/17/2015 for operative treatment ofTrimalleolar fracture of right ankle. Patient has severe unremitting pain that affects sleep, daily activities, and work/hobbies. After pre-op clearance the patient was taken to the operating room on 09/17/2015 and underwent  Procedure(s): OPEN REDUCTION INTERNAL FIXATION (ORIF) RIGHT ANKLE FRACTURE.    Patient was given perioperative antibiotics: Anti-infectives    Start     Dose/Rate Route Frequency Ordered Stop   09/17/15 1630  ceFAZolin (ANCEF) IVPB 1 g/50 mL premix     1 g 100 mL/hr over 30 Minutes Intravenous Every 6 hours 09/17/15 1313 09/18/15 0530   09/17/15 0915  ceFAZolin (ANCEF) IVPB 2g/100 mL premix     2 g 200 mL/hr over 30 Minutes Intravenous To ShortStay Surgical 09/16/15 1044 09/17/15 1027   09/17/15 0734  ceFAZolin (ANCEF) 2-4 GM/100ML-% IVPB    Comments:  [place]   : cabinet override      09/17/15 0734 09/17/15 1944       Patient was given sequential compression devices, early ambulation, and chemoprophylaxis to prevent DVT.  Patient benefited maximally from hospital stay and there were no complications.    Recent vital signs: Patient Vitals for the past 24 hrs:  BP Temp Temp src  Pulse Resp SpO2 Height Weight  09/17/15 1935 114/65 98.6 F (37 C) Oral 80 15 95 % - -  09/17/15 1245 139/85 98.2 F (36.8 C) - 68 10 95 % - -  09/17/15 1230 (!) 148/85 - - 68 12 95 % - -  09/17/15 1215 (!) 155/86 - - 72 12 93 % - -  09/17/15 1207 (!) 144/98 - - 75 14 94 % - -  09/17/15 1204 (!) 152/98 98.1 F (36.7 C) - 69 (!) 6 100 % - -  09/17/15 0935 139/78 - - 69 13 96 % - -  09/17/15 0930 123/83 - - 67 15 93 % - -  09/17/15 0925 (!) 145/72 - - 71 12 92 % - -  09/17/15 0920 135/80 - - 74 12 94 % - -  09/17/15 0915 (!) 155/98 - - 73 (!) 27 98 % - -  09/17/15 0910 138/87 - - 73 16 98 % - -  09/17/15 0909 - - - 71 12 98 % - -  09/17/15 0905 (!) 142/89 - - 69 16 97 % - -  09/17/15 0900 (!) 144/88 - - 72 16 98 % - -  09/17/15 0747 - 98.4 F (36.9 C) - - - - - -  09/17/15 0745 (!) 148/91 - Oral 77 16 97 %  (1.702 m) 102.1 kg (225 lb)     Recent laboratory studies:  Recent Labs  09/17/15 0733  WBC 12.3*  HGB 15.7  HCT 46.0  PLT 287     Discharge Medications:     Medication List    TAKE these medications   atorvastatin 20 MG tablet Commonly known as:  LIPITOR Take 20 mg by mouth daily.   ibuprofen 200 MG tablet Commonly known as:  ADVIL,MOTRIN Take 200 mg by mouth every 6 (six) hours as needed for moderate pain.   oxyCODONE-acetaminophen 5-325 MG tablet Commonly known as:  PERCOCET/ROXICET Take 1-2 tablets by mouth every 4 (four) hours as needed for severe pain. What changed:  when to take this       Diagnostic Studies: Dg Ankle Complete Right  Result Date: 09/17/2015 CLINICAL DATA:  ORIF for right ankle fracture. EXAM: RIGHT ANKLE - COMPLETE 3+ VIEW; DG C-ARM 61-120 MIN COMPARISON:  None. FINDINGS: Three views study shows the patient to be status post ORIF for apparent trimalleolar fracture. Lateral plate and screw fixation device noted on the distal fibula. Ankle mortise appears preserved. Two cannulated compression screws transfix the medial malleolar  fracture. Lateral film shows posterior tibial lip fracture. IMPRESSION: Status post ORIF for apparent trimalleolar ankle fracture. No evidence for immediate hardware complications. Electronically Signed   By: Kennith Center M.D.   On: 09/17/2015 12:11   Dg Ankle Complete Right  Result Date: 09/11/2015 CLINICAL DATA:  Fall, twisting ankle today.  Pain and swelling. EXAM: RIGHT ANKLE - COMPLETE 3+ VIEW COMPARISON:  None. FINDINGS: There is an oblique mildly displaced fracture through the distal right fibula. Transverse mildly displaced fracture through the medial malleolus. Mild widening of the ankle mortise medially. Posterior malleolar fracture also noted off the posterior tibia. IMPRESSION: Mildly displaced trimalleolar fracture. Widening of the ankle mortise medially. Electronically Signed   By: Charlett Nose M.D.   On: 09/11/2015 20:04  Dg C-arm 1-60 Min  Result Date: 09/17/2015 CLINICAL DATA:  ORIF for right ankle fracture. EXAM: RIGHT ANKLE - COMPLETE 3+ VIEW; DG C-ARM 61-120 MIN COMPARISON:  None. FINDINGS: Three views study shows the patient to be status post ORIF for apparent trimalleolar fracture. Lateral plate and screw fixation device noted on the distal fibula. Ankle mortise appears preserved. Two cannulated compression screws transfix the medial malleolar fracture. Lateral film shows posterior tibial lip fracture. IMPRESSION: Status post ORIF for apparent trimalleolar ankle fracture. No evidence for immediate hardware complications. Electronically Signed   By: Kennith Center M.D.   On: 09/17/2015 12:11    Disposition: 01-Home or Self Care  Discharge Instructions    Call MD / Call 911    Complete by:  As directed   If you experience chest pain or shortness of breath, CALL 911 and be transported to the hospital emergency room.  If you develope a fever above 101 F, pus (white drainage) or increased drainage or redness at the wound, or calf pain, call your surgeon's office.   Constipation  Prevention    Complete by:  As directed   Drink plenty of fluids.  Prune juice may be helpful.  You may use a stool softener, such as Colace (over the counter) 100 mg twice a day.  Use MiraLax (over the counter) for constipation as needed.   Diet - low sodium heart healthy    Complete by:  As directed   Discharge patient    Complete by:  As directed   Increase activity slowly as tolerated    Complete by:  As directed      Follow-up Information    Kathryne Hitch, MD Follow up in 3  week(s).   Specialty:  Orthopedic Surgery Contact information: [address] [place] [address] [phone]            Signed: Kathryne Hitch 09/18/2015, 6:55 AM

## 2015-09-22 ENCOUNTER — Encounter (HOSPITAL_COMMUNITY): Payer: Self-pay | Admitting: Orthopaedic Surgery

## 2015-09-25 ENCOUNTER — Other Ambulatory Visit: Payer: Self-pay | Admitting: Orthopaedic Surgery

## 2015-09-25 DIAGNOSIS — Z9889 Other specified postprocedural states: Secondary | ICD-10-CM

## 2015-09-25 DIAGNOSIS — Z8781 Personal history of (healed) traumatic fracture: Principal | ICD-10-CM

## 2015-09-28 ENCOUNTER — Ambulatory Visit (HOSPITAL_BASED_OUTPATIENT_CLINIC_OR_DEPARTMENT_OTHER)
Admission: RE | Admit: 2015-09-28 | Discharge: 2015-09-28 | Disposition: A | Discharge status: Home / Self Care | Payer: Medicare Other | Source: Ambulatory Visit | Attending: Orthopaedic Surgery | Admitting: Orthopaedic Surgery

## 2015-09-28 DIAGNOSIS — Z8781 Personal history of (healed) traumatic fracture: Secondary | ICD-10-CM | POA: Insufficient documentation

## 2015-09-28 DIAGNOSIS — Z9889 Other specified postprocedural states: Secondary | ICD-10-CM

## 2015-09-28 DIAGNOSIS — Z967 Presence of other bone and tendon implants: Secondary | ICD-10-CM | POA: Diagnosis present

## 2015-10-23 ENCOUNTER — Other Ambulatory Visit (HOSPITAL_BASED_OUTPATIENT_CLINIC_OR_DEPARTMENT_OTHER): Payer: Self-pay | Admitting: Orthopaedic Surgery

## 2015-10-23 DIAGNOSIS — M25579 Pain in unspecified ankle and joints of unspecified foot: Secondary | ICD-10-CM

## 2015-10-26 ENCOUNTER — Ambulatory Visit (HOSPITAL_BASED_OUTPATIENT_CLINIC_OR_DEPARTMENT_OTHER)
Admission: RE | Admit: 2015-10-26 | Discharge: 2015-10-26 | Disposition: A | Discharge status: Home / Self Care | Payer: Medicare Other | Source: Ambulatory Visit | Attending: Orthopaedic Surgery | Admitting: Orthopaedic Surgery

## 2015-10-26 DIAGNOSIS — M25579 Pain in unspecified ankle and joints of unspecified foot: Secondary | ICD-10-CM | POA: Insufficient documentation

## 2015-11-26 ENCOUNTER — Other Ambulatory Visit (INDEPENDENT_AMBULATORY_CARE_PROVIDER_SITE_OTHER): Payer: Self-pay | Admitting: Orthopaedic Surgery

## 2015-11-27 ENCOUNTER — Other Ambulatory Visit (INDEPENDENT_AMBULATORY_CARE_PROVIDER_SITE_OTHER): Payer: Self-pay | Admitting: Orthopaedic Surgery

## 2015-11-27 DIAGNOSIS — S82891D Other fracture of right lower leg, subsequent encounter for closed fracture with routine healing: Secondary | ICD-10-CM

## 2015-11-30 ENCOUNTER — Ambulatory Visit (HOSPITAL_BASED_OUTPATIENT_CLINIC_OR_DEPARTMENT_OTHER)
Admission: RE | Admit: 2015-11-30 | Discharge: 2015-11-30 | Disposition: A | Discharge status: Home / Self Care | Payer: Medicare Other | Source: Ambulatory Visit | Attending: Orthopaedic Surgery | Admitting: Orthopaedic Surgery

## 2015-11-30 ENCOUNTER — Ambulatory Visit (INDEPENDENT_AMBULATORY_CARE_PROVIDER_SITE_OTHER): Payer: Medicare Other | Admitting: Orthopaedic Surgery

## 2015-11-30 DIAGNOSIS — W19XXXD Unspecified fall, subsequent encounter: Secondary | ICD-10-CM | POA: Diagnosis not present

## 2015-11-30 DIAGNOSIS — S82891D Other fracture of right lower leg, subsequent encounter for closed fracture with routine healing: Secondary | ICD-10-CM | POA: Diagnosis present

## 2015-11-30 DIAGNOSIS — S82851A Displaced trimalleolar fracture of right lower leg, initial encounter for closed fracture: Secondary | ICD-10-CM

## 2015-12-01 ENCOUNTER — Ambulatory Visit: Payer: Medicare Other | Attending: Orthopaedic Surgery | Admitting: Physical Therapy

## 2015-12-01 DIAGNOSIS — M25671 Stiffness of right ankle, not elsewhere classified: Secondary | ICD-10-CM | POA: Diagnosis not present

## 2015-12-01 DIAGNOSIS — R2689 Other abnormalities of gait and mobility: Secondary | ICD-10-CM | POA: Insufficient documentation

## 2015-12-01 DIAGNOSIS — M25571 Pain in right ankle and joints of right foot: Secondary | ICD-10-CM | POA: Insufficient documentation

## 2015-12-01 NOTE — Patient Instructions (Signed)
ROM: Inversion / Eversion   With rightt leg relaxed, gently turn ankle and foot in and out. Move through full range of motion. Avoid pain. Repeat __15__ times per set. Do __1__ sets per session. Do __several__ sessions per day.  http://orth.exer.us/36   Copyright  VHI. All rights reserved.  ROM: Plantar / Dorsiflexion   With right leg relaxed, gently flex and extend ankle. Move through full range of motion. Avoid pain. Repeat __15__ times per set. Do __1__ sets per session. Do __several__ sessions per day.  http://orth.exer.us/34   Copyright  VHI. All rights reserved.  Ankle Alphabet   Using rightt ankle and foot only, trace the letters of the alphabet. Perform A to Z. Repeat __15__ times per set. Do __1__ sets per session. Do _several___ sessions per day.  http://orth.exer.us/16   Copyright  VHI. All rights reserved.  Ankle Circles   Slowly rotate right foot and ankle clockwise then counterclockwise. Gradually increase range of motion. Avoid pain. Circle _15___ times each direction per set. Do _1___ sets per session. Do _several___ sessions per day.  http://orth.exer.us/30   Copyright  VHI. All rights reserved.

## 2015-12-01 NOTE — Therapy (Signed)
[facility] [address]  [address] [place], [place], [address] Phone: [phone]   Fax:  [phone]  Physical Therapy Evaluation  Patient Details  Name: Dillon Luna MRN: [id number] Date of Birth: [birth year] Referring Provider: Dr. Doneen Poisson  Encounter Date: 12/01/2015      PT End of Session - 12/01/15 1127    Visit Number 1   Number of Visits 12   Date for PT Re-Evaluation 01/12/16   PT Start Time 0930   PT Stop Time 1009   PT Time Calculation (min) 39 min   Activity Tolerance Patient tolerated treatment well   Behavior During Therapy [facility] for tasks assessed/performed      Past Medical History:  Diagnosis Date  . 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 Hyperlipidemia   . Sleep apnea    no cpap - from problem list in Care Everywhere. Pt states he had a sleep study done but didn't need cpap.    Past Surgical History:  Procedure Laterality Date  . COLONOSCOPY    . FINGER AMPUTATION Left 1973   3rd digit  . FRACTURE SURGERY    . ORIF ANKLE FRACTURE Right 09/17/2015  . ORIF ANKLE FRACTURE Right 09/17/2015   Procedure: OPEN REDUCTION INTERNAL FIXATION (ORIF) RIGHT ANKLE FRACTURE;  Surgeon: Kathryne Hitch, MD;  Location: [facility] OR;  Service: Orthopedics;  Laterality: Right;  Marland Kitchen VASECTOMY      There were no vitals filed for this visit.       Subjective Assessment - 12/01/15 0932    Subjective Pt is a 65 y/o male who presents to OPPT s/p R ankle trimalleolar fx with ORIF on 09/17/15.  Pt reports he travels a lot for work but has increased swelling and pain with increased activity.    Limitations Walking;Standing   How long can you stand comfortably? 1/2 day - has to stand all day at trade shows   How long can you walk comfortably? 1/2 day - travels quite a bit for work   Patient Stated Goals improve motion, decrease pain and swelling   Currently in Pain? Yes   Pain Score 0-No pain  up to  6/10   Pain Location Ankle   Pain Orientation Right   Pain Descriptors / Indicators Aching;Sharp;Stabbing  "like a poker"   Pain Type Surgical pain;Chronic pain   Pain Frequency Intermittent   Aggravating Factors  standing, walking   Pain Relieving Factors rest, elevation            [facility] PT Assessment - 12/01/15 0936      Assessment   Medical Diagnosis R ankle ORIF   Referring Provider Dr. Doneen Poisson   Onset Date/Surgical Date 09/17/15   Next MD Visit 6 weeks   Prior Therapy none     Precautions   Precautions None     Restrictions   Weight Bearing Restrictions No     Balance Screen   Has the patient fallen in the past 6 months Yes   How many times? 1   Has the patient had a decrease in activity level because of a fear of falling?  No   Is the patient reluctant to leave their home because of a fear of falling?  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, Recruitment consultant systems for vehicles.  Travels within [place] (plane, car), occasional trade shows   Leisure yardwork     Observation/Other Assessments   Skin Integrity increased edema/swelling in R ankle; healed incision   Focus on Therapeutic Outcomes (FOTO)  54 (46% limited; predicted 34% limited)     Functional Tests   Functional tests Single leg stance     Single Leg Stance   Comments RLE: 11.5 sec; compliant surface: 4.91 sec     ROM / Strength   AROM / PROM / Strength AROM;PROM;Strength     AROM   AROM Assessment Site  Ankle   Right/Left Ankle Right;Left   Right Ankle Dorsiflexion 0   Right Ankle Plantar Flexion 40   Right Ankle Inversion 16   Right Ankle Eversion 10   Left Ankle Dorsiflexion 9   Left Ankle Plantar Flexion 54   Left Ankle Inversion 40   Left Ankle Eversion 8     PROM   PROM Assessment Site Ankle   Right/Left Ankle Right   Right Ankle Dorsiflexion 5   Right Ankle Plantar Flexion 51   Right Ankle Inversion 22   Right Ankle Eversion 19     Strength   Strength Assessment Site Ankle   Right/Left Ankle Right;Left   Right Ankle Dorsiflexion 5/5   Right Ankle Plantar Flexion 3/5   Right Ankle Inversion 5/5   Right Ankle Eversion 5/5   Left Ankle Dorsiflexion 5/5   Left Ankle Plantar Flexion 5/5   Left Ankle Inversion 5/5   Left Ankle Eversion 5/5     Ambulation/Gait   Gait Pattern Decreased stance time - right;Decreased step length - left;Decreased dorsiflexion - right   Gait Comments pt states he still needs to descend stairs with step to pattern                           PT Education - 12/01/15 1126    Education provided Yes   Education Details HEP   Person(s) Educated Patient   Methods Explanation;Demonstration;Handout   Comprehension Verbalized understanding;Returned demonstration;Need further instruction             PT Long Term Goals - 12/01/15 1130      PT LONG TERM GOAL #1   Title independent with HEP (01/12/16)   Time 6   Period Weeks   Status New     PT LONG TERM GOAL #2   Title improve R ankle dorsiflexion to 8 degrees past neutral for improved function and gait mechanics (01/12/16)   Time 6   Period Weeks   Status New     PT LONG TERM GOAL #3   Title perform RLE SLS on compliant surface > 20 sec for improved strength and function (01/12/16)   Time 6   Period Weeks   Status New     PT LONG TERM GOAL #4   Title descend stairs reciprocally without increase in pain or gait abnormalities for improved ROM and function  (01/12/16)   Time 6   Period Weeks   Status New     PT LONG TERM  GOAL #5   Title improve R ankle plantarflexion, inversion and eversion by 10 degrees for improved motion (01/12/16)   Time 6   Period Weeks   Status New               Plan - 12/01/15 1127    Clinical Impression Statement Pt is a 65 y/o male who presents to OPPT for low complexity PT eval s/p R ankle ORIF.  Pt demonstrates decreased ROM and decreased plantarflexion strength affecting gait and functional mobility.  Pt will benefit from PT to maximize function and address deficits listed.   Rehab Potential Good   PT Frequency 2x / week   PT Duration 6 weeks   PT Treatment/Interventions ADLs/Self Care Home Management;Cryotherapy;Electrical Stimulation;Moist Heat;Balance training;Therapeutic exercise;Therapeutic activities;Functional mobility training;Stair training;Gait training;Patient/family education;Manual techniques;Passive range of motion;Vasopneumatic Device;Taping   PT Next Visit Plan review HEP, manual for ROM, functional strengthening   Consulted and Agree with Plan of Care Patient      Patient will benefit from skilled therapeutic intervention in order to improve the following deficits and impairments:  Abnormal gait, Pain, Increased edema, Decreased range of motion, Decreased strength  Visit Diagnosis: Stiffness of right ankle, not elsewhere classified - Plan: PT plan of care cert/re-cert  Pain in right ankle and joints of right foot - Plan: PT plan of care cert/re-cert  Other abnormalities of gait and mobility - Plan: PT plan of care cert/re-cert      G-Codes - 12/01/15 1134    Functional Assessment Tool Used FOTO 46% limited   Functional Limitation Mobility: Walking and moving around   Mobility: Walking and Moving Around Current Status ([id number]) At least 40 percent but less than 60 percent impaired, limited or restricted   Mobility: Walking and Moving Around Goal Status [phone]([id number]) At least 20 percent  but less than 40 percent impaired, limited or restricted       Problem List Patient Active Problem List   Diagnosis Date Noted  . Trimalleolar fracture of right ankle 09/17/2015  . Trimalleolar fracture of ankle, closed 09/17/2015       Clarita CraneStephanie F Nickie Warwick, PT, DPT 12/01/15 11:35 AM    [facility] [place] [address]  [address] [place], [place], [address] Phone: [phone]   Fax:  [phone]  Name: Dillon Luna MRN: [id number] Date of Birth: 1950/12/11

## 2015-12-10 ENCOUNTER — Ambulatory Visit: Payer: Medicare Other

## 2015-12-10 DIAGNOSIS — M25571 Pain in right ankle and joints of right foot: Secondary | ICD-10-CM

## 2015-12-10 DIAGNOSIS — M25671 Stiffness of right ankle, not elsewhere classified: Secondary | ICD-10-CM

## 2015-12-10 DIAGNOSIS — R2689 Other abnormalities of gait and mobility: Secondary | ICD-10-CM

## 2015-12-10 NOTE — Therapy (Signed)
[facility] [place] [address]  [address] [place], [place], [address] Phone: [phone]   Fax:  [phone]  Physical Therapy Treatment  Patient Details  Name: Dillon Luna MRN: [id number] Date of Birth: 1950-07-31 Referring Provider: Dr. Doneen Poissonhristopher Blackman  Encounter Date: 12/10/2015      PT End of Session - 12/10/15 0939    Visit Number 2   Number of Visits 12   Date for PT Re-Evaluation 01/12/16   PT Start Time 0932   PT Stop Time 1015   PT Time Calculation (min) 43 min   Activity Tolerance Patient tolerated treatment well   Behavior During Therapy [facility] for tasks assessed/performed      Past Medical History:  Diagnosis Date  . 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 Hyperlipidemia   . Sleep apnea    no cpap - from problem list in Care Everywhere. Pt states he had a sleep study done but didn't need cpap.    Past Surgical History:  Procedure Laterality Date  . COLONOSCOPY    . FINGER AMPUTATION Left 1973   3rd digit  . FRACTURE SURGERY    . ORIF ANKLE FRACTURE Right 09/17/2015  . ORIF ANKLE FRACTURE Right 09/17/2015   Procedure: OPEN REDUCTION INTERNAL FIXATION (ORIF) RIGHT ANKLE FRACTURE;  Surgeon: Kathryne Hitchhristopher Y Blackman, MD;  Location: [facility] OR;  Service: Orthopedics;  Laterality: Right;  Marland Kitchen. VASECTOMY      There were no vitals filed for this visit.      Subjective Assessment - 12/10/15 0938    Subjective Pt. reporting he has been pain free with all daily activities however still feeling mild pain on stair descending at end of day.     Patient Stated Goals improve motion, decrease pain and swelling   Currently in Pain? No/denies   Pain Score 0-No pain   Multiple Pain Sites No            [facility] Adult PT Treatment/Exercise - 12/10/15 0959      Knee/Hip Exercises: Machines for Strengthening   Cybex Knee Flexion HS curl 35# x 10 reps  B concentric/ R eccentric     Knee/Hip Exercises: Standing   Functional Squat 15 reps   Functional Squat Limitations on TRX   Other Standing Knee Exercises 4" eccentric lowering on R x 10 reps; 1 ski pole support      Manual Therapy   Passive ROM R ankle PROM stretch in all directions x 1 min each      Ankle Exercises: Stretches   Soleus Stretch 3 reps;30 seconds   Gastroc Stretch 2 reps;30 seconds   Other Stretch Prostretch 2 x 30 sec bend and straight leg     Ankle Exercises: Standing   SLS on blue foam oval 3 x 20 ft    Heel Raises 10 reps;2 seconds   Heel Raises Limitations 3 sets at UBE: B con, R ecc            PT Long Term Goals - 12/10/15 0939      PT LONG TERM GOAL #1   Title independent with HEP (01/12/16)   Time 6   Period Weeks   Status On-going     PT LONG TERM GOAL #2   Title improve R ankle dorsiflexion to 8 degrees past neutral for improved function and gait mechanics (01/12/16)   Time 6   Period Weeks   Status On-going     PT  LONG TERM GOAL #3   Title perform RLE SLS on compliant surface > 20 sec for improved strength and function (01/12/16)   Time 6   Period Weeks   Status On-going     PT LONG TERM GOAL #4   Title descend stairs reciprocally without increase in pain or gait abnormalities for improved ROM and function (01/12/16)   Time 6   Period Weeks   Status On-going     PT LONG TERM GOAL #5   Title improve R ankle plantarflexion, inversion and eversion by 10 degrees for improved motion (01/12/16)   Time 6   Period Weeks   Status On-going               Plan - 12/10/15 0944    Clinical Impression Statement Pt. tolerated initiation of hip/knee/ankle strengthening activity today with focus on R PF strengthening well without report of pain.  Pt. with good recall and technique with ankle ROM HEP, and requested calf muscle strengthening activity addition to HEP.  Pt. to return tomorrow morning to therapy and will plan to monitor respone to strengthening activity.   PT Treatment/Interventions  ADLs/Self Care Home Management;Cryotherapy;Electrical Stimulation;Moist Heat;Balance training;Therapeutic exercise;Therapeutic activities;Functional mobility training;Stair training;Gait training;Patient/family education;Manual techniques;Passive range of motion;Vasopneumatic Device;Taping   PT Next Visit Plan Monitor response to initiation of ankle/knee/hip strengthening activity; add PF strengthening to HEP; manual for ROM, functional strengthening      Patient will benefit from skilled therapeutic intervention in order to improve the following deficits and impairments:  Abnormal gait, Pain, Increased edema, Decreased range of motion, Decreased strength  Visit Diagnosis: Stiffness of right ankle, not elsewhere classified  Pain in right ankle and joints of right foot  Other abnormalities of gait and mobility     Problem List Patient Active Problem List   Diagnosis Date Noted  . Trimalleolar fracture of right ankle 09/17/2015  . Trimalleolar fracture of ankle, closed 09/17/2015    Dillon Luna, Dillon Luna 12/10/15 10:29 AM  [facility] [address]  [address] [place], [place], [address] Phone: [phone]   Fax:  [phone]  Name: Dillon Luna MRN: [id number] Date of Birth: November 24, 1950

## 2015-12-11 ENCOUNTER — Ambulatory Visit: Payer: Medicare Other

## 2015-12-11 DIAGNOSIS — M25671 Stiffness of right ankle, not elsewhere classified: Secondary | ICD-10-CM | POA: Diagnosis not present

## 2015-12-11 DIAGNOSIS — R2689 Other abnormalities of gait and mobility: Secondary | ICD-10-CM

## 2015-12-11 DIAGNOSIS — M25571 Pain in right ankle and joints of right foot: Secondary | ICD-10-CM

## 2015-12-11 NOTE — Therapy (Addendum)
[facility] [place] [address]  [address] [place], [place], [address] Phone: [phone]   Fax:  [phone]  Physical Therapy Treatment  Patient Details  Name: Dillon FerraraRonald C Luna MRN: [id number] Date of Birth: [birth year] Referring Provider: Dr. Doneen Poissonhristopher Blackman  Encounter Date: 12/11/2015      PT End of Session - 12/11/15 0920    Visit Number 3   Number of Visits 12   Date for PT Re-Evaluation 01/12/16   PT Start Time 0847   PT Stop Time 0930   PT Time Calculation (min) 43 min   Activity Tolerance Patient tolerated treatment well   Behavior During Therapy [facility] for tasks assessed/performed      Past Medical History:  Diagnosis Date  . 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 Hyperlipidemia   . Sleep apnea    no cpap - from problem list in Care Everywhere. Pt states he had a sleep study done but didn't need cpap.    Past Surgical History:  Procedure Laterality Date  . COLONOSCOPY    . FINGER AMPUTATION Left 1973   3rd digit  . FRACTURE SURGERY    . ORIF ANKLE FRACTURE Right 09/17/2015  . ORIF ANKLE FRACTURE Right 09/17/2015   Procedure: OPEN REDUCTION INTERNAL FIXATION (ORIF) RIGHT ANKLE FRACTURE;  Surgeon: Kathryne Hitchhristopher Y Blackman, MD;  [facility]: [facility] OR;  Service: Orthopedics;  Laterality: Right;  Marland Kitchen. VASECTOMY      There were no vitals filed for this visit.      Subjective Assessment - 12/11/15 0900    Subjective Pt. reporting his R calf muscle is mildly sore however feeling good after initiation of strengthening activity yesterday.     Patient Stated Goals improve motion, decrease pain and swelling   Currently in Pain? No/denies   Pain Score 0-No pain   Multiple Pain Sites No              [facility] Adult PT Treatment/Exercise - 12/11/15 0906      Knee/Hip Exercises: Machines for Strengthening   Cybex Knee Flexion HS curl 35# x 15 reps   Cybex Leg Press R single leg calf raise bent and straight leg x 15  reps each      Knee/Hip Exercises: Standing   Hip ADduction --   Hip ADduction Limitations --   Hip Extension --   Extension Limitations --   Functional Squat --   Functional Squat Limitations --   Other Standing Knee Exercises 4-way SLR with green TB on R L leg stance x 10 reps    Other Standing Knee Exercises 6" R step up with therapist pulling black TB into knee flexion x 15 reps; high tension; 1 UE support on ski pole     Manual Therapy   Manual therapy comments Anterior R ankle mobs with seated belt in lunge position    Passive ROM R ankle PROM stretch in all directions x 1 min each      Ankle Exercises: Stretches   Soleus Stretch 3 reps;30 seconds   Gastroc Stretch 3 reps;30 seconds   Other Stretch Prostretch 2 x 30 sec bend and straight leg     Ankle Exercises: Standing   Heel Raises 15 reps;2 seconds   Heel Raises Limitations 4 sets at UBE: B con, R ecc   Other Standing Ankle Exercises 4" R quad eccentric lowering x 10; 1 ski pole support      Ankle Exercises: Aerobic  Stationary Bike Recumbent bike: lvl 4, 6 min           PT Long Term Goals - 12/10/15 0939      PT LONG TERM GOAL #1   Title independent with HEP (01/12/16)   Time 6   Period Weeks   Status On-going     PT LONG TERM GOAL #2   Title improve R ankle dorsiflexion to 8 degrees past neutral for improved function and gait mechanics (01/12/16)   Time 6   Period Weeks   Status On-going     PT LONG TERM GOAL #3   Title perform RLE SLS on compliant surface > 20 sec for improved strength and function (01/12/16)   Time 6   Period Weeks   Status On-going     PT LONG TERM GOAL #4   Title descend stairs reciprocally without increase in pain or gait abnormalities for improved ROM and function (01/12/16)   Time 6   Period Weeks   Status On-going     PT LONG TERM GOAL #5   Title improve R ankle plantarflexion, inversion and eversion by 10 degrees for improved motion (01/12/16)   Time 6   Period  Weeks   Status On-going               Plan - 12/11/15 0920    Clinical Impression Statement Pt. reporting he tolerated yesterday's initiation into calf muscle strengthening and aggressive stretching well without soreness this morning.  Eccentric calf strengthening and stretching activities added to HEP today and will plan to review this with pt. next visit.  Pt. leaving treatment today pain free.     PT Treatment/Interventions ADLs/Self Care Home Management;Cryotherapy;Electrical Stimulation;Moist Heat;Balance training;Therapeutic exercise;Therapeutic activities;Functional mobility training;Stair training;Gait training;Patient/family education;Manual techniques;Passive range of motion;Vasopneumatic Device;Taping   PT Next Visit Plan Review PF strengthening HEP; Continue ankle/knee/hip strengthening activity; manual for ROM, functional strengthening      Patient will benefit from skilled therapeutic intervention in order to improve the following deficits and impairments:  Abnormal gait, Pain, Increased edema, Decreased range of motion, Decreased strength  Visit Diagnosis: Stiffness of right ankle, not elsewhere classified  Pain in right ankle and joints of right foot  Other abnormalities of gait and mobility     Problem List Patient Active Problem List   Diagnosis Date Noted  . Trimalleolar fracture of right ankle 09/17/2015  . Trimalleolar fracture of ankle, closed 09/17/2015    Kermit Balo, PTA 12/11/15 11:58 AM  [facility] [address]  [address] [place], [place], [address] Phone: [phone]   Fax:  [phone]  Name: Dillon Luna MRN: [id number] Date of Birth: 08/27/1950

## 2015-12-15 ENCOUNTER — Ambulatory Visit: Payer: Medicare Other | Admitting: Physical Therapy

## 2015-12-15 DIAGNOSIS — M25671 Stiffness of right ankle, not elsewhere classified: Secondary | ICD-10-CM

## 2015-12-15 DIAGNOSIS — R2689 Other abnormalities of gait and mobility: Secondary | ICD-10-CM

## 2015-12-15 DIAGNOSIS — M25571 Pain in right ankle and joints of right foot: Secondary | ICD-10-CM

## 2015-12-15 NOTE — Therapy (Signed)
[facility] [place] [address]  [address] [place], [place], [address] Phone: [phone]   Fax:  [phone]  Physical Therapy Treatment  Patient Details  Name: Dillon Luna MRN: [id number] Date of Birth: 1950-05-23 Referring Provider: Dr. Doneen Poissonhristopher Blackman  Encounter Date: 12/15/2015      PT End of Session - 12/15/15 1019    Visit Number 4   Number of Visits 12   Date for PT Re-Evaluation 01/12/16   PT Start Time 0930   PT Stop Time 1011   PT Time Calculation (min) 41 min   Activity Tolerance Patient tolerated treatment well   Behavior During Therapy [facility] for tasks assessed/performed      Past Medical History:  Diagnosis Date  . 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 Hyperlipidemia   . Sleep apnea    no cpap - from problem list in Care Everywhere. Pt states he had a sleep study done but didn't need cpap.    Past Surgical History:  Procedure Laterality Date  . COLONOSCOPY    . FINGER AMPUTATION Left 1973   3rd digit  . FRACTURE SURGERY    . ORIF ANKLE FRACTURE Right 09/17/2015  . ORIF ANKLE FRACTURE Right 09/17/2015   Procedure: OPEN REDUCTION INTERNAL FIXATION (ORIF) RIGHT ANKLE FRACTURE;  Surgeon: Kathryne Hitchhristopher Y Blackman, MD;  Location: [facility] OR;  Service: Orthopedics;  Laterality: Right;  Marland Kitchen. VASECTOMY      There were no vitals filed for this visit.      Subjective Assessment - 12/15/15 0931    Subjective Ankle is feeling pretty good today, no pain   Limitations Walking;Standing   Patient Stated Goals improve motion, decrease pain and swelling   Currently in Pain? No/denies                         [facility] Treatment/Exercise - 12/15/15 0934      Ankle Exercises: Aerobic   Stationary Bike Recumbent bike: lvl 3, 6 min     Ankle Exercises: Stretches   Soleus Stretch 3 reps;30 seconds;Limitations   Soleus Stretch Limitations pro stretch   Gastroc Stretch 3 reps;30  seconds;Limitations   Gastroc Stretch Limitations pro stretch     Ankle Exercises: Supine   T-Band 4 way blue theraband x 10 reps     Ankle Exercises: Standing   SLS on compliant surface: single tap to 8" cones with minguard A and intermittent UE support   Heel Walk (Round Trip) 85' x 1   Toe Walk (Round Trip) 85' x 1   Other Standing Ankle Exercises sidestepping and tandem walking on foam beam with supervision                     PT Long Term Goals - 12/10/15 0939      PT LONG TERM GOAL #1   Title independent with HEP (01/12/16)   Time 6   Period Weeks   Status On-going     PT LONG TERM GOAL #2   Title improve R ankle dorsiflexion to 8 degrees past neutral for improved function and gait mechanics (01/12/16)   Time 6   Period Weeks   Status On-going     PT LONG TERM GOAL #3   Title perform RLE SLS on compliant surface > 20 sec for improved strength and function (01/12/16)   Time 6   Period Weeks   Status On-going  PT LONG TERM GOAL #4   Title descend stairs reciprocally without increase in pain or gait abnormalities for improved ROM and function (01/12/16)   Time 6   Period Weeks   Status On-going     PT LONG TERM GOAL #5   Title improve R ankle plantarflexion, inversion and eversion by 10 degrees for improved motion (01/12/16)   Time 6   Period Weeks   Status On-going               Plan - 12/15/15 1019    Clinical Impression Statement Pt tolerated exercises well without increase in pain.  Continues to have difficulty with compliant surfaces, especially with dynamic activities.  Will continue to benefit from PT to maximize functional mobility.   PT Next Visit Plan Review PF strengthening HEP; Continue ankle/knee/hip strengthening activity; manual for ROM, functional strengthening   Consulted and Agree with Plan of Care Patient      Patient will benefit from skilled therapeutic intervention in order to improve the following deficits and  impairments:  Abnormal gait, Pain, Increased edema, Decreased range of motion, Decreased strength  Visit Diagnosis: Stiffness of right ankle, not elsewhere classified  Pain in right ankle and joints of right foot  Other abnormalities of gait and mobility     Problem List Patient Active Problem List   Diagnosis Date Noted  . Trimalleolar fracture of right ankle 09/17/2015  . Trimalleolar fracture of ankle, closed 09/17/2015      Clarita CraneStephanie F Wynter Isaacs, PT, DPT 12/15/15 10:22 AM    [facility] [place] [address]  [address] [place], [place], [address] Phone: [phone]   Fax:  [phone]  Name: Dillon Luna MRN: [id number] Date of Birth: 1950/07/21

## 2015-12-18 ENCOUNTER — Ambulatory Visit: Payer: Medicare Other | Attending: Orthopaedic Surgery | Admitting: Physical Therapy

## 2015-12-18 DIAGNOSIS — M25571 Pain in right ankle and joints of right foot: Secondary | ICD-10-CM

## 2015-12-18 DIAGNOSIS — R2689 Other abnormalities of gait and mobility: Secondary | ICD-10-CM | POA: Insufficient documentation

## 2015-12-18 DIAGNOSIS — M25671 Stiffness of right ankle, not elsewhere classified: Secondary | ICD-10-CM | POA: Insufficient documentation

## 2015-12-18 NOTE — Patient Instructions (Signed)
Single Leg (Compliant Surface) - Eyes Open    Stand on compliant surface: __pillow__ holding support. Lift left leg while maintaining balance over other leg. Progress to removing hands from support surface for longer periods of time. Hold__30__ seconds. Repeat __3-5__ times per session. Do __1-2__ sessions per day.  Progress to moving left leg in half circle pattern or marching with left leg.  Can also have someone provide little pushes to challenge balance.  Copyright  VHI. All rights reserved.

## 2015-12-18 NOTE — Therapy (Signed)
[facility] [place] [address]  [address] [place], [place], [address] Phone: [phone]   Fax:  [phone]  Physical Therapy Treatment  Patient Details  Name: Dillon Luna MRN: [id number] Date of Birth: May 08, 1950 Referring Provider: Dr. Doneen Poissonhristopher Blackman  Encounter Date: 12/18/2015      PT End of Session - 12/18/15 1014    Visit Number 5   Number of Visits 12   Date for PT Re-Evaluation 01/12/16   PT Start Time 0930   PT Stop Time 1010   PT Time Calculation (min) 40 min   Activity Tolerance Patient tolerated treatment well   Behavior During Therapy [facility] for tasks assessed/performed      Past Medical History:  Diagnosis Date  . 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 Hyperlipidemia   . Sleep apnea    no cpap - from problem list in Care Everywhere. Pt states he had a sleep study done but didn't need cpap.    Past Surgical History:  Procedure Laterality Date  . COLONOSCOPY    . FINGER AMPUTATION Left 1973   3rd digit  . FRACTURE SURGERY    . ORIF ANKLE FRACTURE Right 09/17/2015  . ORIF ANKLE FRACTURE Right 09/17/2015   Procedure: OPEN REDUCTION INTERNAL FIXATION (ORIF) RIGHT ANKLE FRACTURE;  Surgeon: Kathryne Hitchhristopher Y Blackman, MD;  Location: [facility] OR;  Service: Orthopedics;  Laterality: Right;  Marland Kitchen. VASECTOMY      There were no vitals filed for this visit.      Subjective Assessment - 12/18/15 0930    Subjective no pain; ankle feels pretty good.   Limitations Walking;Standing   Patient Stated Goals improve motion, decrease pain and swelling   Currently in Pain? No/denies                         [facility] Treatment/Exercise - 12/18/15 0933      Exercises   Exercises Ankle     Ankle Exercises: Aerobic   Stationary Bike Recumbent bike: lvl 3, 6 min     Ankle Exercises: Stretches   Soleus Stretch 3 reps;30 seconds   Soleus Stretch Limitations pro stretch   Gastroc Stretch 3 reps;30  seconds   Gastroc Stretch Limitations pro stretch     Ankle Exercises: Machines for Strengthening   Cybex Leg Press RLE calf raises 20# x 10; 25# x10     Ankle Exercises: Standing   SLS on foam balance beam: single tap/double tap to 8" cones; RLE SLS on compliant surface 3x30 sec; 3x 10 LLE movement   Rebounder SLS on foam with ball toss with red med ball   Heel Raises 20 reps  RLE off UBE   Other Standing Ankle Exercises sidestepping blue theraband 20'x2; 4 way  calf raises x 5 each RLE with green theraband   Other Standing Ankle Exercises sidestepping and tandem walking on foam beam with supervision                PT Education - 12/18/15 1014    Education provided Yes   Education Details SLS HEP   Person(s) Educated Patient   Methods Explanation   Comprehension Verbalized understanding             PT Long Term Goals - 12/10/15 0939      PT LONG TERM GOAL #1   Title independent with HEP (01/12/16)   Time 6   Period Weeks  Status On-going     PT LONG TERM GOAL #2   Title improve R ankle dorsiflexion to 8 degrees past neutral for improved function and gait mechanics (01/12/16)   Time 6   Period Weeks   Status On-going     PT LONG TERM GOAL #3   Title perform RLE SLS on compliant surface > 20 sec for improved strength and function (01/12/16)   Time 6   Period Weeks   Status On-going     PT LONG TERM GOAL #4   Title descend stairs reciprocally without increase in pain or gait abnormalities for improved ROM and function (01/12/16)   Time 6   Period Weeks   Status On-going     PT LONG TERM GOAL #5   Title improve R ankle plantarflexion, inversion and eversion by 10 degrees for improved motion (01/12/16)   Time 6   Period Weeks   Status On-going               Plan - 12/18/15 1015    Clinical Impression Statement Pt reports decreased pain and improved motion since beginning PT.  Has a trade show next week and will follow up after to see how he  does.  Continues to have some instability with SLS activities but progressing well.   PT Treatment/Interventions ADLs/Self Care Home Management;Cryotherapy;Electrical Stimulation;Moist Heat;Balance training;Therapeutic exercise;Therapeutic activities;Functional mobility training;Stair training;Gait training;Patient/family education;Manual techniques;Passive range of motion;Vasopneumatic Device;Taping   PT Next Visit Plan Continue ankle/knee/hip strengthening activity; manual for ROM, functional strengthening   Consulted and Agree with Plan of Care Patient      Patient will benefit from skilled therapeutic intervention in order to improve the following deficits and impairments:  Abnormal gait, Pain, Increased edema, Decreased range of motion, Decreased strength  Visit Diagnosis: Stiffness of right ankle, not elsewhere classified  Pain in right ankle and joints of right foot  Other abnormalities of gait and mobility     Problem List Patient Active Problem List   Diagnosis Date Noted  . Trimalleolar fracture of right ankle 09/17/2015  . Trimalleolar fracture of ankle, closed 09/17/2015       Clarita CraneStephanie F Juliet Vasbinder, PT, DPT 12/18/15 10:24 AM    [facility] [place] [address]  [address] [place], [place], [address] Phone: [phone]   Fax:  [phone]  Name: Dillon FerraraRonald C Luna MRN: [id number] Date of Birth: 1950-06-19

## 2015-12-24 ENCOUNTER — Ambulatory Visit: Payer: Medicare Other | Admitting: Physical Therapy

## 2015-12-24 DIAGNOSIS — M25671 Stiffness of right ankle, not elsewhere classified: Secondary | ICD-10-CM | POA: Diagnosis not present

## 2015-12-24 DIAGNOSIS — M25571 Pain in right ankle and joints of right foot: Secondary | ICD-10-CM

## 2015-12-24 DIAGNOSIS — R2689 Other abnormalities of gait and mobility: Secondary | ICD-10-CM

## 2015-12-24 NOTE — Therapy (Addendum)
[facility] [address]  [place], [place], [address] Phone: [phone]   Fax:  [phone]  Physical Therapy Treatment  Patient Details  Name: Dillon Luna MRN: [id number] Date of Birth: September 09, 1950 Referring Provider: Dr. Jean Rosenthal    Encounter Date: 12/24/2015      PT End of Session - 12/24/15 1001    Visit Number 6   PT Start Time 0931   PT Stop Time 1000   PT Time Calculation (min) 29 min   Activity Tolerance Patient tolerated treatment well   Behavior During Therapy [facility] for tasks assessed/performed      Past Medical History:  Diagnosis Date  . 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 Hyperlipidemia   . Sleep apnea    no cpap - from problem list in Care Everywhere. Pt states he had a sleep study done but didn't need cpap.    Past Surgical History:  Procedure Laterality Date  . COLONOSCOPY    . FINGER AMPUTATION Left 1973   3rd digit  . FRACTURE SURGERY    . ORIF ANKLE FRACTURE Right 09/17/2015  . ORIF ANKLE FRACTURE Right 09/17/2015   Procedure: OPEN REDUCTION INTERNAL FIXATION (ORIF) RIGHT ANKLE FRACTURE;  Surgeon: Mcarthur Rossetti, MD;  Location: [facility];  Service: Orthopedics;  Laterality: Right;  Marland Kitchen VASECTOMY      There were no vitals filed for this visit.      Subjective Assessment - 12/24/15 0933    Subjective just returned from trade show; feels ankle was "okay."  still c/o some swelling but wasn't as tired.    Limitations Walking;Standing   How long can you stand comfortably? full day   How long can you walk comfortably? distances seem okay   Patient Stated Goals improve motion, decrease pain and swelling   Currently in Pain? No/denies            [facility] PT Assessment - 12/24/15 0941      Observation/Other Assessments   Focus on Therapeutic Outcomes (FOTO)  79 (21% limited)     Single Leg Stance   Comments RLE 21.6 sec on compliant surface     AROM   Right Ankle Dorsiflexion 4  past neutral; 8 degrees past neutral in standing   Right Ankle Plantar Flexion 54   Right Ankle Inversion 30   Right Ankle Eversion 20     Ambulation/Gait   Stairs Yes   Stairs Assistance 7: Independent   Stair Management Technique No rails;Forwards;Alternating pattern   Number of Stairs 16   Height of Stairs 8                     [facility] Adult PT Treatment/Exercise - 12/24/15 0941      Self-Care   Self-Care Other Self-Care Comments   Other Self-Care Comments  verbally reviewed HEP, provided recommendation and progression     Ankle Exercises: Aerobic   Stationary Bike Recumbent bike: lvl 4, 6 min     Ankle Exercises: Standing   SLS on compliant surface 5 x 10-20 sec                     PT Long Term Goals - 12/24/15 1001      PT LONG TERM GOAL #1   Title independent with HEP (01/12/16)   Status Achieved     PT LONG TERM GOAL #2   Title improve R ankle  dorsiflexion to 8 degrees past neutral for improved function and gait mechanics (01/12/16)   Baseline met in standing; not met in supine   Status Partially Met     PT LONG TERM GOAL #3   Title perform RLE SLS on compliant surface > 20 sec for improved strength and function (01/12/16)   Status Achieved     PT LONG TERM GOAL #4   Title descend stairs reciprocally without increase in pain or gait abnormalities for improved ROM and function (01/12/16)   Status Achieved     PT LONG TERM GOAL #5   Title improve R ankle plantarflexion, inversion and eversion by 10 degrees for improved motion (01/12/16)   Status Achieved               Plan - 2016/01/09 1001    Clinical Impression Statement Pt has met all goals and is tolerating work responsibilities without difficulty.  At this time holding PT x 30 days; if pt doesn't return will d/c.   PT Treatment/Interventions ADLs/Self Care Home Management;Cryotherapy;Electrical Stimulation;Moist Heat;Balance training;Therapeutic  exercise;Therapeutic activities;Functional mobility training;Stair training;Gait training;Patient/family education;Manual techniques;Passive range of motion;Vasopneumatic Device;Taping   PT Next Visit Plan hold x 30 days; will need renewal and new gcode if pt returns   Consulted and Agree with Plan of Care Patient      Patient will benefit from skilled therapeutic intervention in order to improve the following deficits and impairments:  Abnormal gait, Pain, Increased edema, Decreased range of motion, Decreased strength  Visit Diagnosis: Stiffness of right ankle, not elsewhere classified  Pain in right ankle and joints of right foot  Other abnormalities of gait and mobility       G-Codes - 09-Jan-2016 1003    Functional Assessment Tool Used FOTO 21% limited   Functional Limitation Mobility: Walking and moving around   Mobility: Walking and Moving Around Goal Status [phone]) At least 20 percent but less than 40 percent impaired, limited or restricted   Mobility: Walking and Moving Around Discharge Status [phone]) At least 20 percent but less than 40 percent impaired, limited or restricted      Problem List Patient Active Problem List   Diagnosis Date Noted  . Trimalleolar fracture of right ankle 09/17/2015  . Trimalleolar fracture of ankle, closed 09/17/2015       Laureen Abrahams, PT, DPT 2016-01-09 10:04 AM    [facility] [address]  [place], [place], [address] Phone: [phone]   Fax:  [phone]  Name: Dillon Luna MRN: [id number] Date of Birth: 03-Feb-1951       PHYSICAL THERAPY DISCHARGE SUMMARY  Visits from Start of Care: 6  Current functional level related to goals / functional outcomes: See above   Remaining deficits: n/a   Education / Equipment: HEP  Plan: Patient agrees to discharge.  Patient goals were met. Patient is being discharged due to meeting the stated rehab goals.   ?????     Laureen Abrahams, PT, DPT 01/25/16 8:35 AM  [facility] [place], [place] [address]  [phone] (office) [phone] (fax)

## 2016-01-18 ENCOUNTER — Ambulatory Visit (INDEPENDENT_AMBULATORY_CARE_PROVIDER_SITE_OTHER): Payer: Medicare Other | Admitting: Orthopaedic Surgery

## 2016-01-18 DIAGNOSIS — S82851D Displaced trimalleolar fracture of right lower leg, subsequent encounter for closed fracture with routine healing: Secondary | ICD-10-CM | POA: Diagnosis not present

## 2016-01-18 NOTE — Progress Notes (Signed)
Mr. Dillon Luna is now over 4 months status post a reduction and fixation of a trimalleolar ankle fracture of his right ankle. He's been to physical therapy now and is doing well. He does express swelling in his ankles and stiffness but overall he is very pleased. He is very active 65 year old who mainly just works but does note significant athletic types of activities.  On examination of his ankle there is global swelling and as to be expected but his range of motion is entirely full and the ankle feels ligament was a stable. There is no redness. His incisions well-healed. His foot is well perfused with normal sensation.  At this point a continued increase his activities. We long discussion talking about activities to avoid as well as the potential for him developing post traumatic arthritis in his ankle. I told him come back and see us any time for any type of evaluation of this ankle of his hurting him worse. If he does come in for specifically his right ankle up on new x-rays which be 3 views of the right ankle. Until my next step would be an intra-articular injection of yet or is bothering him enough the right now he is very pleased with his progress. He'll follow up as needed.

## 2018-06-16 IMAGING — DX DG ANKLE COMPLETE 3+V*R*
3 series · 3 of 3 positions shown · non-contrast
Comparison: None.

CLINICAL DATA: Fall, twisting ankle today.  Pain and swelling.

EXAM:
RIGHT ANKLE - COMPLETE 3+ VIEW

[ankle ap]
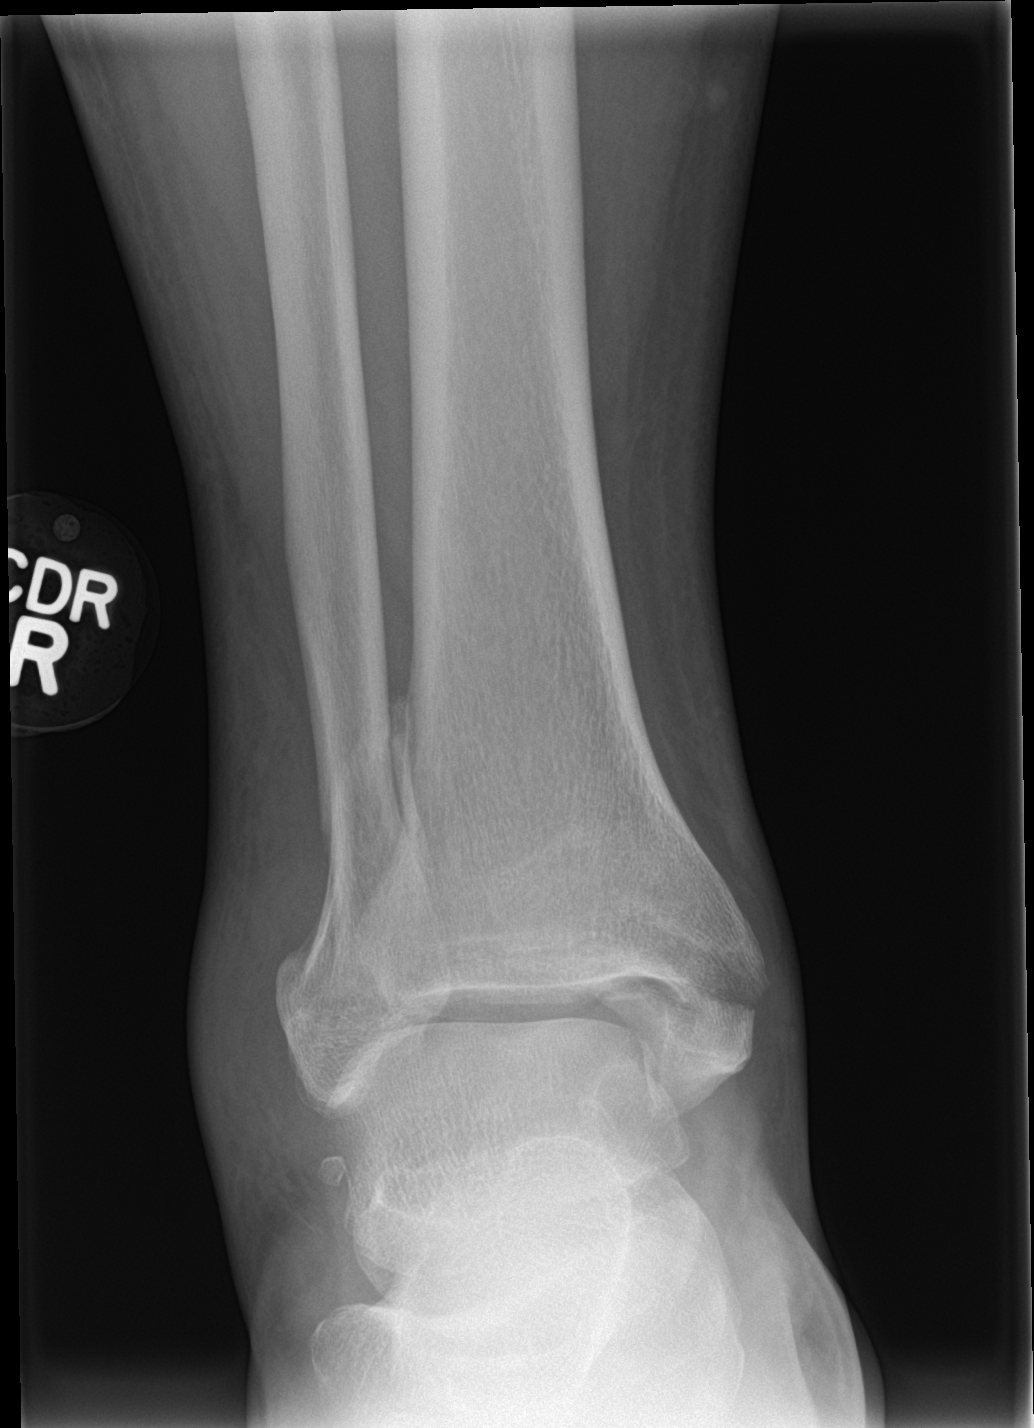

[ankle obl]
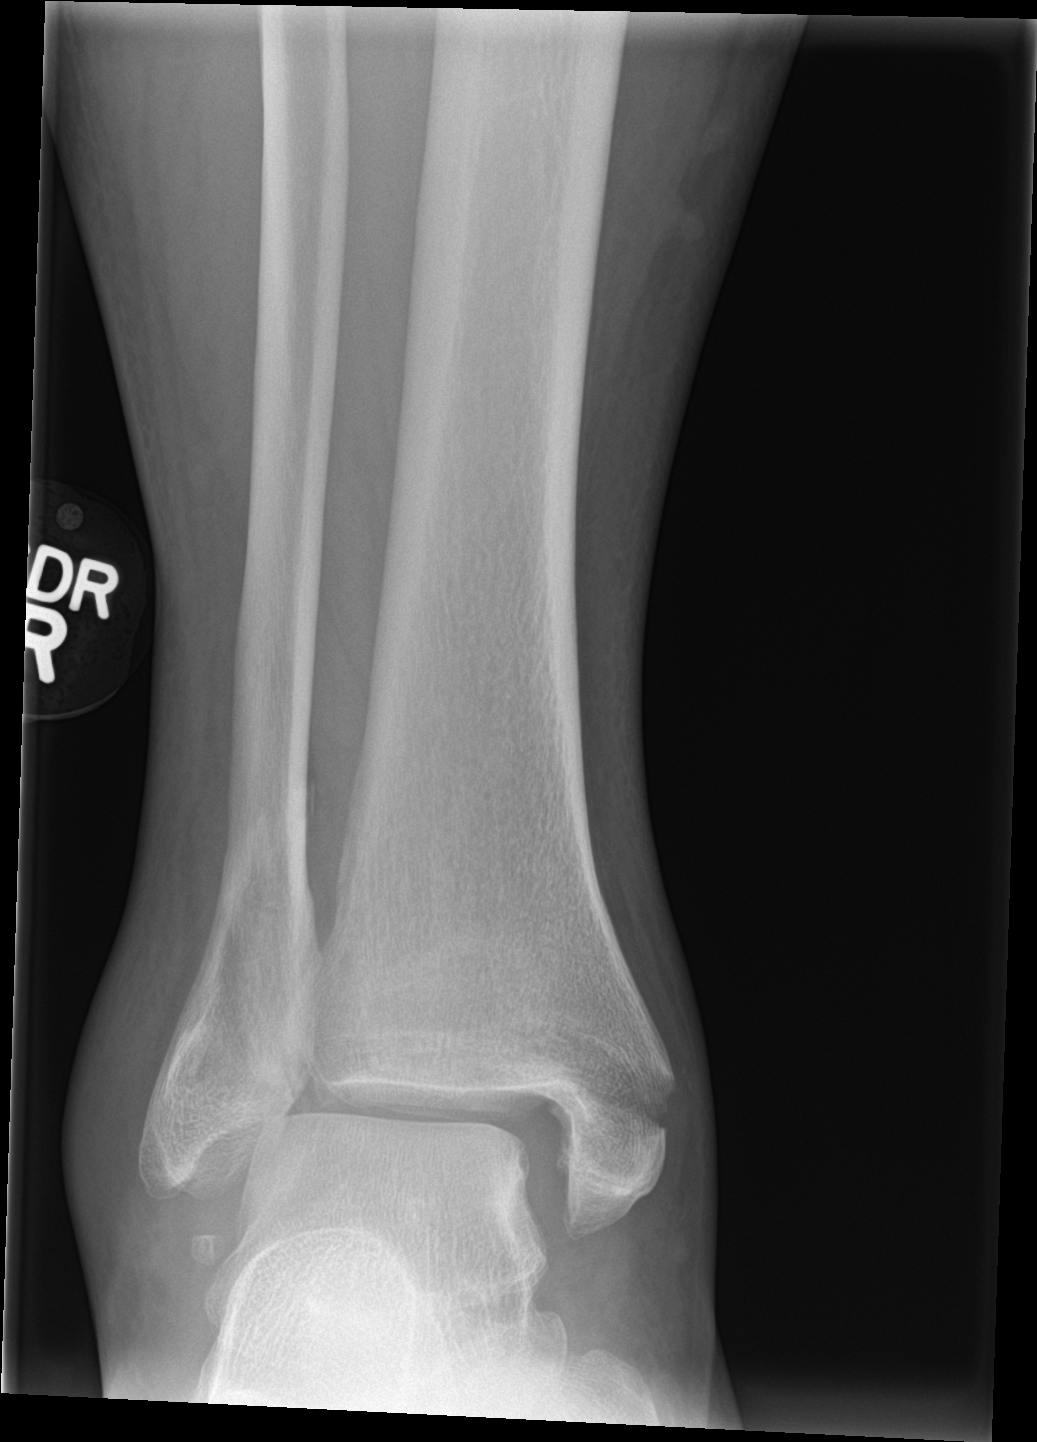

[ankle lat]
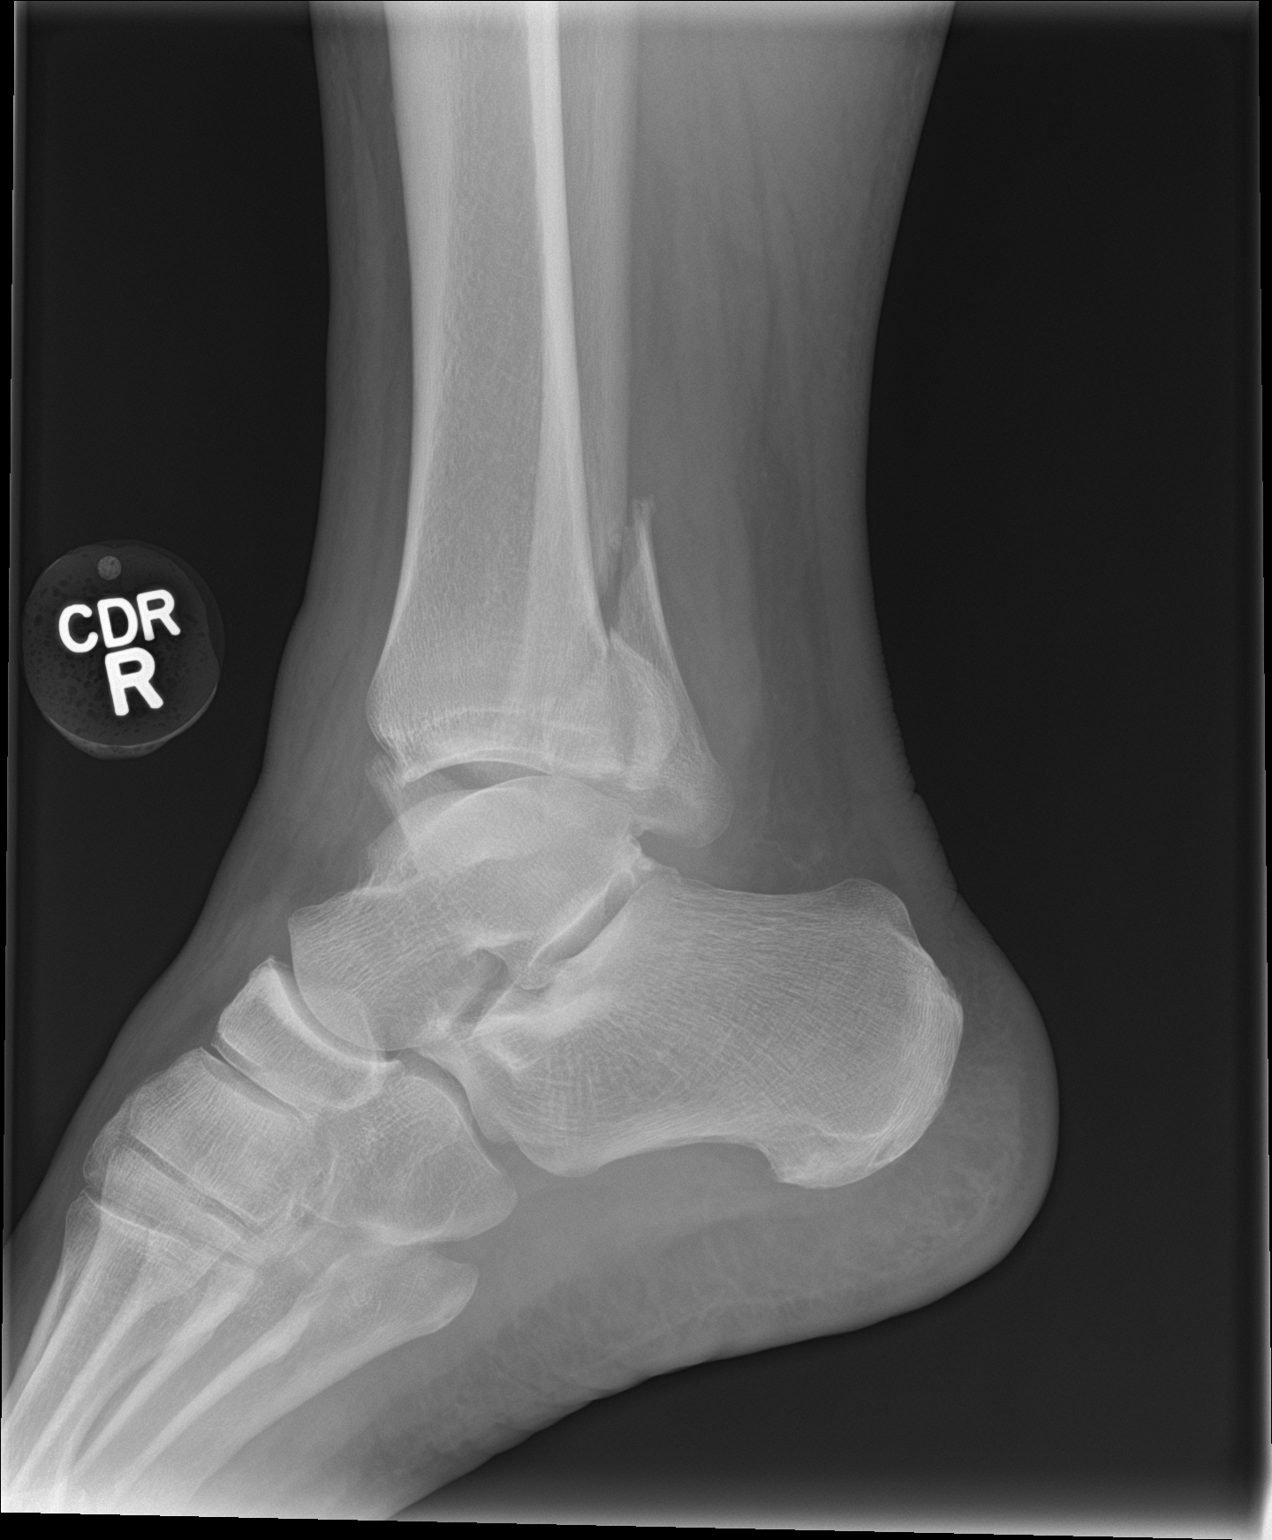

[3 of 3 positions shown; findings below may reference images not displayed]

FINDINGS: There is an oblique mildly displaced fracture through the distal
right fibula. Transverse mildly displaced fracture through the
medial malleolus. Mild widening of the ankle mortise medially.
Posterior malleolar fracture also noted off the posterior tibia.
IMPRESSION: Mildly displaced trimalleolar fracture. Widening of the ankle
mortise medially.

## 2022-05-06 ENCOUNTER — Ambulatory Visit
Admission: EM | Admit: 2022-05-06 | Discharge: 2022-05-06 | Disposition: A | Discharge status: Home / Self Care | Payer: Medicare PPO | Attending: Physician Assistant | Admitting: Physician Assistant

## 2022-05-06 DIAGNOSIS — J069 Acute upper respiratory infection, unspecified: Secondary | ICD-10-CM | POA: Diagnosis not present

## 2022-05-06 MED ORDER — PROMETHAZINE-DM 6.25-15 MG/5ML PO SYRP: 5.0000 mL | ORAL_SOLUTION | Freq: Four times a day (QID) | ORAL | 0 refills | Status: DC | PRN

## 2022-05-06 NOTE — ED Triage Notes (Signed)
Pt presents with c/o chest cold x 4 days.    Pt states he has been coughing and feeling tired. Reports he has taken alka seltzer plus only.

## 2022-05-09 ENCOUNTER — Ambulatory Visit
Admission: EM | Admit: 2022-05-09 | Discharge: 2022-05-09 | Disposition: A | Discharge status: Home / Self Care | Payer: Medicare PPO

## 2022-05-09 DIAGNOSIS — J329 Chronic sinusitis, unspecified: Secondary | ICD-10-CM | POA: Diagnosis not present

## 2022-05-09 DIAGNOSIS — J40 Bronchitis, not specified as acute or chronic: Secondary | ICD-10-CM

## 2022-05-09 MED ORDER — AMOXICILLIN 875 MG PO TABS: 875.0000 mg | ORAL_TABLET | Freq: Two times a day (BID) | ORAL | 0 refills | Status: AC

## 2022-05-09 MED ORDER — CETIRIZINE HCL 10 MG PO TABS: 10.0000 mg | ORAL_TABLET | Freq: Every day | ORAL | 0 refills | Status: AC

## 2022-05-09 MED ORDER — PREDNISONE 20 MG PO TABS: ORAL_TABLET | ORAL | 0 refills | Status: AC

## 2022-05-09 MED ORDER — BENZONATATE 100 MG PO CAPS: 100.0000 mg | ORAL_CAPSULE | Freq: Three times a day (TID) | ORAL | 0 refills | Status: AC | PRN

## 2022-05-09 NOTE — ED Provider Notes (Signed)
[facility]  Note:  This document was prepared using Systems analyst and may include unintentional dictation errors.  MRN: [id number] DOB: [birth year]  Subjective:   Dillon Luna is a 72 y.o. male presenting for 1 week history of persistent coughing worse at night when he lays down.  Has had some wheezing, has a history of bronchitis.  Has also had left ear fullness, general malaise.  No chest pain, shortness of breath.  Used to be a heavy smoker but quit many years ago.  Is monitored by his PCP.  No current facility-administered medications for this encounter.  Current Outpatient Medications:    amLODipine (NORVASC) 5 MG tablet, Take by mouth., Disp: , Rfl:    atorvastatin (LIPITOR) 20 MG tablet, Take 20 mg by mouth daily., Disp: , Rfl:    ibuprofen (ADVIL,MOTRIN) 200 MG tablet, Take 200 mg by mouth every 6 (six) hours as needed for moderate pain., Disp: , Rfl:    oxyCODONE-acetaminophen (PERCOCET/ROXICET) 5-325 MG tablet, Take 1-2 tablets by mouth every 4 (four) hours as needed for severe pain. (Patient not taking: Reported on 01/18/2016), Disp: 60 tablet, Rfl: 0   promethazine-dextromethorphan (PROMETHAZINE-DM) 6.25-15 MG/5ML syrup, Take 5 mLs by mouth 4 (four) times daily as needed for cough., Disp: 120 mL, Rfl: 0   No Known Allergies  Past Medical History:  Diagnosis Date   Hepatitis    had hepatitis in 2nd grade   Hyperlipidemia    Sleep apnea    no cpap - from problem list in Care Everywhere. Pt states he had a sleep study done but didn't need cpap.     Past Surgical History:  Procedure Laterality Date   COLONOSCOPY     FINGER AMPUTATION Left 1973   3rd digit   FRACTURE SURGERY     ORIF ANKLE FRACTURE Right 09/17/2015   ORIF ANKLE FRACTURE Right 09/17/2015   Procedure: OPEN REDUCTION INTERNAL FIXATION (ORIF) RIGHT ANKLE FRACTURE;  Surgeon: Mcarthur Rossetti, MD;  Location: [facility];  Service: Orthopedics;  Laterality: Right;    VASECTOMY      Family History  Problem Relation Age of Onset   Lung cancer Mother    Lung cancer Father     Social History   Tobacco Use   Smoking status: Former    Packs/day: 0.50    Years: 30.00    Additional pack years: 0.00    Total pack years: 15.00    Types: Cigarettes   Smokeless tobacco: Never   Tobacco comments:    "quit smoking in the 1990s"  Substance Use Topics   Alcohol use: Yes    Alcohol/week: 2.0 standard drinks of alcohol    Types: 2 Glasses of wine per week   Drug use: No    ROS   Objective:   Vitals: BP (!) 149/81 (BP Location: Left Arm)   Pulse 66   Temp 98 F (36.7 C) (Oral)   Resp 18   SpO2 93%   Physical Exam Constitutional:      General: He is not in acute distress.    Appearance: Normal appearance. He is well-developed and normal weight. He is not ill-appearing, toxic-appearing or diaphoretic.  HENT:     Head: Normocephalic and atraumatic.     Right Ear: Tympanic membrane, ear canal and external ear normal. No drainage, swelling or tenderness. No middle ear effusion. There is no impacted cerumen. Tympanic membrane is not erythematous or bulging.     Left Ear:  Tympanic membrane, ear canal and external ear normal. No drainage, swelling or tenderness.  No middle ear effusion. There is no impacted cerumen. Tympanic membrane is not erythematous or bulging.     Nose: Congestion present. No rhinorrhea.     Mouth/Throat:     Mouth: Mucous membranes are moist.     Pharynx: No oropharyngeal exudate or posterior oropharyngeal erythema.  Eyes:     General: No scleral icterus.       Right eye: No discharge.        Left eye: No discharge.     Extraocular Movements: Extraocular movements intact.     Conjunctiva/sclera: Conjunctivae normal.  Cardiovascular:     Rate and Rhythm: Normal rate and regular rhythm.     Heart sounds: Normal heart sounds. No murmur heard.    No friction rub. No gallop.  Pulmonary:     Effort: Pulmonary effort is  normal. No respiratory distress.     Breath sounds: No stridor. Rhonchi (mild over mid lung fields bilaterally) present. No wheezing or rales.  Musculoskeletal:     Cervical back: Normal range of motion and neck supple. No rigidity. No muscular tenderness.  Neurological:     General: No focal deficit present.     Mental Status: He is alert and oriented to person, place, and time.  Psychiatric:        Mood and Affect: Mood normal.        Behavior: Behavior normal.        Thought Content: Thought content normal.     Assessment and Plan :   PDMP not reviewed this encounter.  1. Sinobronchitis     Will manage for sinobronchitis with amoxicillin and prednisone.  Recommended supportive care otherwise.  Will defer chest x-ray for now.  Given timeline of illness, also deferred respiratory testing. Counseled patient on potential for adverse effects with medications prescribed/recommended today, ER and return-to-clinic precautions discussed, patient verbalized understanding.    Jaynee Eagles, [place] 05/09/22 1439

## 2022-05-09 NOTE — ED Provider Notes (Signed)
[facility]    CSN: [id number] Arrival date & time: 05/06/22  1105      History   Chief Complaint Chief Complaint  Patient presents with   Cough    HPI Dillon Luna is a 72 y.o. male.   Pt complains of a cough and congestion.  Pt reports he has had similar in the past.  Pt request medication for cough.   The history is provided by the patient. No language interpreter was used.  Cough Cough characteristics:  Non-productive Sputum characteristics:  Nondescript Severity:  Moderate Duration:  4 days Timing:  Constant Progression:  Worsening Chronicity:  New   Past Medical History:  Diagnosis Date   Hepatitis    had hepatitis in 2nd grade   Hyperlipidemia    Sleep apnea    no cpap - from problem list in Care Everywhere. Pt states he had a sleep study done but didn't need cpap.    Patient Active Problem List   Diagnosis Date Noted   Trimalleolar fracture of right ankle 09/17/2015   Trimalleolar fracture of ankle, closed 09/17/2015    Past Surgical History:  Procedure Laterality Date   COLONOSCOPY     FINGER AMPUTATION Left 1973   3rd digit   FRACTURE SURGERY     ORIF ANKLE FRACTURE Right 09/17/2015   ORIF ANKLE FRACTURE Right 09/17/2015   Procedure: OPEN REDUCTION INTERNAL FIXATION (ORIF) RIGHT ANKLE FRACTURE;  Surgeon: Mcarthur Rossetti, MD;  Location: [facility];  Service: Orthopedics;  Laterality: Right;   VASECTOMY         Home Medications    Prior to Admission medications   Medication Sig Start Date End Date Taking? Authorizing Provider  promethazine-dextromethorphan (PROMETHAZINE-DM) 6.25-15 MG/5ML syrup Take 5 mLs by mouth 4 (four) times daily as needed for cough. 05/06/22  Yes Caryl Ada K, PA-C  amLODipine (NORVASC) 5 MG tablet Take by mouth. 03/04/22   [provider]  atorvastatin (LIPITOR) 20 MG tablet Take 20 mg by mouth daily.    [provider]  ibuprofen (ADVIL,MOTRIN) 200 MG tablet Take 200 mg by mouth  every 6 (six) hours as needed for moderate pain.    [provider]  oxyCODONE-acetaminophen (PERCOCET/ROXICET) 5-325 MG tablet Take 1-2 tablets by mouth every 4 (four) hours as needed for severe pain. Patient not taking: Reported on 01/18/2016 09/18/15   Mcarthur Rossetti, MD    Family History Family History  Problem Relation Age of Onset   Lung cancer Mother    Lung cancer Father     Social History Social History   Tobacco Use   Smoking status: Former    Packs/day: 0.50    Years: 30.00    Additional pack years: 0.00    Total pack years: 15.00    Types: Cigarettes   Smokeless tobacco: Never   Tobacco comments:    "quit smoking in the 1990s"  Substance Use Topics   Alcohol use: Yes    Alcohol/week: 2.0 standard drinks of alcohol    Types: 2 Glasses of wine per week   Drug use: No     Allergies   Patient has no known allergies.   Review of Systems Review of Systems  Respiratory:  Positive for cough.   All other systems reviewed and are negative.    Physical Exam Triage Vital Signs ED Triage Vitals  Enc Vitals Group     BP 05/06/22 1226 127/73     Pulse Rate 05/06/22 1226 70  Resp 05/06/22 1226 16     Temp 05/06/22 1226 99.6 F (37.6 C)     Temp Source 05/06/22 1226 Oral     SpO2 05/06/22 1226 91 %     Weight --      Height --      Head Circumference --      Peak Flow --      Pain Score 05/06/22 1224 0     Pain Loc --      Pain Edu? --      Excl. in [place]? --    No data found.  Updated Vital Signs BP 127/73 (BP Location: Left Arm)   Pulse 70   Temp 99.6 F (37.6 C) (Oral)   Resp 16   SpO2 91%   Visual Acuity Right Eye Distance:   Left Eye Distance:   Bilateral Distance:    Right Eye Near:   Left Eye Near:    Bilateral Near:     Physical Exam Vitals and nursing note reviewed.  Constitutional:      Appearance: He is well-developed.  HENT:     Head: Normocephalic.  Cardiovascular:     Rate and Rhythm: Normal rate.   Pulmonary:     Effort: Pulmonary effort is normal.  Abdominal:     General: There is no distension.  Musculoskeletal:        General: Normal range of motion.     Cervical back: Normal range of motion.  Neurological:     Mental Status: He is alert and oriented to person, place, and time.      UC Treatments / Results  Labs (all labs ordered are listed, but only abnormal results are displayed) Labs Reviewed - No data to display  EKG   Radiology No results found.  Procedures Procedures (including critical care time)  Medications Ordered in UC Medications - No data to display  Initial Impression / Assessment and Plan / UC Course  I have reviewed the triage vital signs and the nursing notes.  Pertinent labs & imaging results that were available during my care of the patient were reviewed by me and considered in my medical decision making (see chart for details).      Final Clinical Impressions(s) / UC Diagnoses   Final diagnoses:  None   Discharge Instructions   None    ED Prescriptions     Medication Sig Dispense Auth. Provider   promethazine-dextromethorphan (PROMETHAZINE-DM) 6.25-15 MG/5ML syrup Take 5 mLs by mouth 4 (four) times daily as needed for cough. 120 mL Fransico Meadow, [place]      PDMP not reviewed this encounter. An After Visit Summary was printed and given to the patient.    Fransico Meadow, [place] 05/09/22 1357

## 2022-05-09 NOTE — ED Triage Notes (Signed)
Pt reports cough x 1 week; left ear fullness x 1 day. Cough is worse when lay down. Promethazine makes him feel sick, nauseous.Marland Kitchen

## 2023-02-21 ENCOUNTER — Ambulatory Visit: Payer: Medicare PPO | Admitting: Orthopaedic Surgery

## 2023-02-21 ENCOUNTER — Encounter: Payer: Self-pay | Admitting: Orthopaedic Surgery

## 2023-02-21 ENCOUNTER — Other Ambulatory Visit (INDEPENDENT_AMBULATORY_CARE_PROVIDER_SITE_OTHER): Payer: Medicare PPO

## 2023-02-21 VITALS — BP 122/78 | HR 72 | Ht 67.0 in | Wt 210.0 lb

## 2023-02-21 DIAGNOSIS — M65961 Unspecified synovitis and tenosynovitis, right lower leg: Secondary | ICD-10-CM | POA: Insufficient documentation

## 2023-02-21 DIAGNOSIS — M25561 Pain in right knee: Secondary | ICD-10-CM

## 2023-02-21 MED ORDER — BUPIVACAINE HCL 0.25 % IJ SOLN
4.0000 mL | INTRAMUSCULAR | Status: AC | PRN
  Administered 2023-02-21: 4 mL via INTRA_ARTICULAR

## 2023-02-21 MED ORDER — METHYLPREDNISOLONE ACETATE 40 MG/ML IJ SUSP
40.0000 mg | INTRAMUSCULAR | Status: AC | PRN
  Administered 2023-02-21: 40 mg via INTRA_ARTICULAR

## 2023-02-21 MED ORDER — LIDOCAINE HCL 1 % IJ SOLN
0.5000 mL | INTRAMUSCULAR | Status: AC | PRN
  Administered 2023-02-21: .5 mL

## 2023-02-21 NOTE — Progress Notes (Signed)
 Office Visit Note   Patient: Dillon Luna           Date of Birth: 07/19/1950           MRN: [id number] Visit Date: 02/21/2023              Requested by: Marshall Lowers, MD No address on file PCP: Marshall Lowers, MD   Assessment & Plan: Visit Diagnoses:  1. Acute pain of right knee   2. 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  We discussed imaging studies versus injection.  He is already tried anti-inflammatories.  Right knee injection performed. He tolerated well if he has persistent problems he will let us  know. Follow-Up Instructions: Return if symptoms worsen or fail to improve.   Orders:  Orders Placed This Encounter  Procedures   XR KNEE 3 VIEW RIGHT   No orders of the defined types were placed in this encounter.     Procedures: Large Joint Inj: R knee on 02/21/2023 9:47 AM Indications: pain and joint swelling Details: 22 G 1.5 in needle, anterolateral approach  Arthrogram: No  Medications: 40 mg methylPREDNISolone  acetate 40 MG/ML; 0.5 mL lidocaine  1 %; 4 mL bupivacaine  0.25 % Outcome: tolerated well, no immediate complications Procedure, treatment alternatives, risks and benefits explained, specific risks discussed. Consent was given by the patient. Immediately prior to procedure a time out was called to verify the correct patient, procedure, equipment, support staff and site/side marked as required. Patient was prepped and draped in the usual sterile fashion.       Clinical Data: No additional findings.   Subjective: Chief Complaint  Patient presents with   Right Knee - Pain    HPI 73 year old male previous ankle fracture fixation by Dr. Vernetta few years ago seen with right knee pain.  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  Pain medially discomfort knee feels tight.  He is use Tylenol  activity modification.  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  Patient is had no locking of his right knee no obvious problems with his left knee.  Right knee is more painful primarily medial by the end of the day.  Ankle post-ORIF is doing well.  Review of Systems all systems noncontributory to HPI.  He does have some hypertension hyperlipidemia.   Objective: Vital Signs: BP 122/78   Pulse 72   Ht 5' 7 (1.702 m)   Wt 210 lb (95.3 kg)   BMI 32.89 kg/m   Physical Exam Constitutional:      Appearance: He is well-developed.  HENT:     Head: Normocephalic and atraumatic.     Right Ear: External ear normal.     Left Ear: External ear normal.  Eyes:     Pupils: Pupils are equal, round, and reactive to light.  Neck:     Thyroid: No thyromegaly.     Trachea: No tracheal deviation.  Cardiovascular:     Rate and Rhythm: Normal rate.  Pulmonary:     Effort: Pulmonary effort is normal.     Breath sounds: No wheezing.  Abdominal:     General: Bowel sounds are normal.     Palpations:  Abdomen is soft.  Musculoskeletal:     Cervical back: Neck supple.  Skin:    General: Skin is warm and dry.     Capillary Refill: Capillary refill takes less than 2 seconds.  Neurological:     Mental Status: He is alert and oriented to person, place, and time.  Psychiatric:        Behavior: Behavior normal.        Thought Content: Thought content normal.        Judgment: Judgment normal.     Ortho Exam patient is ambulatory with a trace right knee limp.  Negative logroll hips knee reaches full extension some tenderness medial joint line.  Medial collateral ligament is tight ACL PCL exam normal.  Full extension both knees but bilateral knee crepitus with extension.  Pain with patellofemoral loading.  Specialty Comments:  No specialty comments  available.  Imaging: XR KNEE 3 VIEW RIGHT Result Date: 02/21/2023 Three-view x-rays right knee obtained and reviewed.  No acute bone changes.  Small marginal osteophytes patellofemoral joint.  No significant joint space narrowing. Impression: Right knee radiograph negative for acute changes.  Minimal patellofemoral spurring.    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 Pt states he had a sleep study done but didn't need cpap.    Family History  Problem Relation Age of Onset   Lung cancer Mother    Lung cancer Father     Past Surgical History:  Procedure Laterality Date   COLONOSCOPY     FINGER AMPUTATION Left 1973   3rd digit   FRACTURE SURGERY     ORIF ANKLE FRACTURE Right 09/17/2015   ORIF ANKLE FRACTURE Right 09/17/2015   Procedure: OPEN REDUCTION INTERNAL FIXATION (ORIF) RIGHT ANKLE FRACTURE;  Surgeon: Lonni CINDERELLA Poli, MD;  Location: [facility];  Service: Orthopedics;  Laterality: Right;   VASECTOMY     Social History   Occupational History   Not on file  Tobacco Use   Smoking status: Former    Current packs/day: 0.50    Average packs/day: 0.5 packs/day for 30.0 years (15.0 ttl pk-yrs)    Types: Cigarettes   Smokeless tobacco: Never   Tobacco comments:    quit smoking in the 1990s  Substance and Sexual Activity   Alcohol use: Yes    Alcohol/week: 2.0 standard drinks of alcohol    Types: 2 Glasses of wine per week   Drug use: No   Sexual activity: Not Currently

## 2023-03-07 ENCOUNTER — Ambulatory Visit: Payer: Medicare PPO | Admitting: Orthopaedic Surgery

## 2023-03-07 VITALS — BP 142/86 | HR 75

## 2023-03-07 DIAGNOSIS — M65961 Unspecified synovitis and tenosynovitis, right lower leg: Secondary | ICD-10-CM

## 2023-03-07 NOTE — Progress Notes (Unsigned)
   Office Visit Note   Patient: Dillon Luna           Date of Birth: 06-06-1950           MRN: [id number] Visit Date: 03/07/2023              Requested by: Brooke Bonito, MD No address on file PCP: Brooke Bonito, MD   Assessment & Plan: Visit Diagnoses: No diagnosis found.  Plan: ***  Follow-Up Instructions: Return in about 3 weeks (around 03/28/2023).   Orders:  No orders of the defined types were placed in this encounter.  No orders of the defined types were placed in this encounter.     Procedures: No procedures performed   Clinical Data: No additional findings.   Subjective: Chief Complaint  Patient presents with   Right Knee - Pain    HPI  Review of Systems   Objective: Vital Signs: BP (!) 142/86 (BP Location: Left Arm, Patient Position: Sitting)   Pulse 75   Physical Exam  Ortho Exam  Specialty Comments:  No specialty comments available.  Imaging: No results found.   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 Pt states he had a sleep study done but didn't need cpap.    Family History  Problem Relation Age of Onset   Lung cancer Mother    Lung cancer Father     Past Surgical History:  Procedure Laterality Date   COLONOSCOPY     FINGER AMPUTATION Left 1973   3rd digit   FRACTURE SURGERY     ORIF ANKLE FRACTURE Right 09/17/2015   ORIF ANKLE FRACTURE Right 09/17/2015   Procedure: OPEN REDUCTION INTERNAL FIXATION (ORIF) RIGHT ANKLE FRACTURE;  Surgeon: Kathryne Hitch, MD;  Location: [facility] OR;  Service: Orthopedics;  Laterality: Right;   VASECTOMY     Social History   Occupational History    Not on file  Tobacco Use   Smoking status: Former    Current packs/day: 0.50    Average packs/day: 0.5 packs/day for 30.0 years (15.0 ttl pk-yrs)    Types: Cigarettes   Smokeless tobacco: Never   Tobacco comments:    "quit smoking in the 1990s"  Substance and Sexual Activity   Alcohol use: Yes    Alcohol/week: 2.0 standard drinks of alcohol    Types: 2 Glasses of wine per week   Drug use: No   Sexual activity: Not Currently

## 2023-12-18 ENCOUNTER — Encounter: Payer: Self-pay | Admitting: Radiology
